# Patient Record
Sex: Female | Born: 1985 | Race: White | Hispanic: No | Marital: Single | Smoking: Never smoker
Health system: Southern US, Community
[De-identification: ages and names within clinical notes are randomized; demographics above are authoritative.]

## PROBLEM LIST (undated history)

## (undated) ENCOUNTER — Inpatient Hospital Stay: Payer: Self-pay

## (undated) ENCOUNTER — Inpatient Hospital Stay (HOSPITAL_COMMUNITY): Payer: Self-pay

## (undated) DIAGNOSIS — A6000 Herpesviral infection of urogenital system, unspecified: Secondary | ICD-10-CM

## (undated) DIAGNOSIS — T4145XA Adverse effect of unspecified anesthetic, initial encounter: Secondary | ICD-10-CM

## (undated) DIAGNOSIS — T8859XA Other complications of anesthesia, initial encounter: Secondary | ICD-10-CM

## (undated) HISTORY — PX: EYE SURGERY: SHX253

---
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
## 2006-09-02 ENCOUNTER — Emergency Department: Payer: Self-pay | Admitting: Emergency Medicine

## 2006-10-13 ENCOUNTER — Emergency Department: Payer: Self-pay | Admitting: Emergency Medicine

## 2006-11-22 ENCOUNTER — Emergency Department: Payer: Self-pay | Admitting: Emergency Medicine

## 2006-12-06 ENCOUNTER — Emergency Department: Payer: Self-pay | Admitting: Emergency Medicine

## 2007-06-13 ENCOUNTER — Emergency Department: Payer: Self-pay | Admitting: Emergency Medicine

## 2007-07-19 ENCOUNTER — Emergency Department: Payer: Self-pay

## 2007-07-30 ENCOUNTER — Ambulatory Visit: Payer: Self-pay | Admitting: Family Medicine

## 2007-10-12 ENCOUNTER — Ambulatory Visit: Payer: Self-pay | Admitting: Family Medicine

## 2007-11-03 ENCOUNTER — Observation Stay: Payer: Self-pay

## 2007-11-04 ENCOUNTER — Ambulatory Visit: Payer: Self-pay | Admitting: Family Medicine

## 2007-11-26 ENCOUNTER — Observation Stay: Payer: Self-pay | Admitting: Obstetrics and Gynecology

## 2007-11-30 ENCOUNTER — Observation Stay: Payer: Self-pay

## 2007-12-10 ENCOUNTER — Observation Stay: Payer: Self-pay | Admitting: Obstetrics and Gynecology

## 2007-12-24 ENCOUNTER — Observation Stay: Payer: Self-pay | Admitting: Obstetrics and Gynecology

## 2007-12-31 ENCOUNTER — Inpatient Hospital Stay: Payer: Self-pay

## 2008-04-06 ENCOUNTER — Emergency Department: Payer: Self-pay | Admitting: Emergency Medicine

## 2010-06-15 ENCOUNTER — Emergency Department: Payer: Self-pay | Admitting: Emergency Medicine

## 2010-12-02 ENCOUNTER — Emergency Department: Payer: Self-pay | Admitting: Emergency Medicine

## 2011-04-09 ENCOUNTER — Emergency Department: Payer: Self-pay | Admitting: *Deleted

## 2011-04-09 LAB — HCG, QUANTITATIVE, PREGNANCY: Beta Hcg, Quant.: 20249 m[IU]/mL — ABNORMAL HIGH

## 2011-04-09 LAB — URINALYSIS, COMPLETE
Bilirubin,UR: NEGATIVE
Blood: NEGATIVE
Glucose,UR: NEGATIVE mg/dL (ref 0–75)
Ketone: NEGATIVE
Nitrite: NEGATIVE
Ph: 6 (ref 4.5–8.0)
Protein: NEGATIVE
RBC,UR: 9 /HPF (ref 0–5)
Specific Gravity: 1.025 (ref 1.003–1.030)
Squamous Epithelial: 64
WBC UR: 7 /HPF (ref 0–5)

## 2011-04-09 LAB — WET PREP, GENITAL

## 2011-04-11 LAB — URINE CULTURE

## 2011-04-23 ENCOUNTER — Encounter (HOSPITAL_COMMUNITY): Payer: Self-pay | Admitting: *Deleted

## 2011-04-23 ENCOUNTER — Inpatient Hospital Stay (HOSPITAL_COMMUNITY)
Admission: AD | Admit: 2011-04-23 | Discharge: 2011-04-23 | Disposition: A | Discharge status: Home / Self Care | Payer: Medicaid Other | Source: Ambulatory Visit | Attending: Obstetrics and Gynecology | Admitting: Obstetrics and Gynecology

## 2011-04-23 DIAGNOSIS — O479 False labor, unspecified: Secondary | ICD-10-CM

## 2011-04-23 DIAGNOSIS — R109 Unspecified abdominal pain: Secondary | ICD-10-CM | POA: Insufficient documentation

## 2011-04-23 DIAGNOSIS — O99891 Other specified diseases and conditions complicating pregnancy: Secondary | ICD-10-CM | POA: Insufficient documentation

## 2011-04-23 HISTORY — DX: Herpesviral infection of urogenital system, unspecified: A60.00

## 2011-04-23 LAB — URINALYSIS, ROUTINE W REFLEX MICROSCOPIC
Bilirubin Urine: NEGATIVE
Glucose, UA: NEGATIVE mg/dL
Ketones, ur: NEGATIVE mg/dL
Leukocytes, UA: NEGATIVE
Nitrite: NEGATIVE
Protein, ur: NEGATIVE mg/dL
Specific Gravity, Urine: >1.03 — ABNORMAL HIGH (ref 1.005–1.030)
Urobilinogen, UA: 0.2 mg/dL (ref 0.0–1.0)
pH: 6 (ref 5.0–8.0)

## 2011-04-23 LAB — URINE MICROSCOPIC-ADD ON

## 2011-04-23 MED ORDER — IBUPROFEN 200 MG PO TABS: 600.0000 mg | ORAL_TABLET | Freq: Four times a day (QID) | ORAL | Status: DC | PRN

## 2011-04-23 NOTE — MAU Note (Signed)
PT SAYS  AT  545PM-   FELT BACK HURTING AND  COME AROUND TO FRONT-.  DID NOT TAKE ANY MED.   [facility]- WITH DR Phineas Real IN [place]-- DID NOT CALL OFFICE

## 2011-04-23 NOTE — Discharge Instructions (Signed)
Abdominal Pain During Pregnancy °Belly (abdominal) pain is common during pregnancy. Most of the time, it is not a serious problem. Other times, it can be a sign that something is wrong with the pregnancy. Always tell your doctor if you have belly pain. °HOME CARE °For mild pain: °· Do not have sex (intercourse) or put anything in your vagina until you feel better.  °· Rest until your pain stops. If your pain lasts longer than 1 hour, call your doctor.  °· Drink clear fluids if you feel sick to your stomach (nauseous).  °· Do not eat solid food until you feel better.  °· Only take medicine as told by your doctor.  °· Keep all doctor visits as told.  °GET HELP RIGHT AWAY IF:  °· You are bleeding, leaking fluid, or pieces of tissue come out of your vagina.  °· You have more pain or cramping.  °· You keep throwing up (vomiting).  °· You have pain when you pee (urinate) or have blood in your pee.  °· You have a fever.  °· You do not feel your baby moving as much.  °· You feel very weak or feel like passing out.  °· You have trouble breathing, with or without belly pain.  °· You have a very bad headache and belly pain.  °· You have fluid leaking from your vagina and belly pain.  °· You keep having watery poop (diarrhea).  °· Your belly pain does not go away after resting, or the pain gets worse.  °MAKE SURE YOU:  °· Understand these instructions.  °· Will watch your condition.  °· Will get help right away if you are not doing well or get worse.  °Document Released: 12/12/2008 Document Revised: 12/13/2010 Document Reviewed: 07/20/2010 °ExitCare® Patient Information ©2012 [employer]. °

## 2011-04-23 NOTE — MAU Note (Signed)
Pt in c/o contraction-like pain that starts in back and radiates to front, making stomach very tight.  States pains started around 6 pm.  Denies any bleeding or discharge.  Occasional fetal movements.

## 2011-04-23 NOTE — MAU Provider Note (Signed)
  History     CSN: [id number]  Arrival date and time: 04/23/11 2036   First Provider Initiated Contact with Patient 04/23/11 2156      No chief complaint on file.  HPI  OB History    Grav Para Term Preterm Abortions TAB SAB Ect Mult Living   3 2 2       2       Past Medical History  Diagnosis Date  . Herpes genitalia     Past Surgical History  Procedure Date  . Eye surgery     Family History  Problem Relation Age of Onset  . Anesthesia problems Neg Hx     History  Substance Use Topics  . Smoking status: Never Smoker   . Smokeless tobacco: Not on file  . Alcohol Use: No    Allergies: No Known Allergies  No prescriptions prior to admission    ROS Physical Exam   Blood pressure 116/61, pulse 97, temperature 97.4 F (36.3 C), temperature source Oral, resp. rate 18, height 5\' 5"  (1.651 m), weight 91.173 kg (201 lb).  Physical Exam  Constitutional: She is oriented to person, place, and time. She appears well-developed and well-nourished.  HENT:  Head: Normocephalic.  Neck: Normal range of motion.  Cardiovascular: Normal rate.   Respiratory: Effort normal.  GI:       Soft; gravid; approp for dates  Genitourinary:        cx C/L  Musculoskeletal: Normal range of motion.  Neurological: She is alert and oriented to person, place, and time.  Skin: Skin is warm and dry.  Psychiatric: She has a normal mood and affect. Her behavior is normal. Thought content normal.   Urinalysis    Component Value Date/Time   COLORURINE YELLOW 04/23/2011 2048   APPEARANCEUR CLEAR 04/23/2011 2048   LABSPEC >1.030* 04/23/2011 2048   PHURINE 6.0 04/23/2011 2048   GLUCOSEU NEGATIVE 04/23/2011 2048   HGBUR TRACE* 04/23/2011 2048   BILIRUBINUR NEGATIVE 04/23/2011 2048   KETONESUR NEGATIVE 04/23/2011 2048   PROTEINUR NEGATIVE 04/23/2011 2048   UROBILINOGEN 0.2 04/23/2011 2048   NITRITE NEGATIVE 04/23/2011 2048   LEUKOCYTESUR NEGATIVE 04/23/2011 2048      MAU Course   Procedures     Assessment and Plan  IUP at 17.5 wks Abd cramping  D/C home with comfort tips Increase fluid/water intake Rx Motrin 600mg  q 6 hrs x 72 hrs F/U at reg OB visit or sooner prn  Roselle Norton 04/23/2011, 10:04 PM

## 2011-04-24 NOTE — MAU Provider Note (Signed)
Agree with above note.  Krystal Harrison 04/24/2011 6:57 AM

## 2011-04-26 ENCOUNTER — Inpatient Hospital Stay (HOSPITAL_COMMUNITY): Payer: Medicaid Other

## 2011-04-26 ENCOUNTER — Encounter (HOSPITAL_COMMUNITY): Payer: Self-pay

## 2011-04-26 ENCOUNTER — Inpatient Hospital Stay (HOSPITAL_COMMUNITY)
Admission: AD | Admit: 2011-04-26 | Discharge: 2011-04-26 | Disposition: A | Discharge status: Home / Self Care | Payer: Medicaid Other | Source: Ambulatory Visit | Attending: Obstetrics & Gynecology | Admitting: Obstetrics & Gynecology

## 2011-04-26 DIAGNOSIS — O99891 Other specified diseases and conditions complicating pregnancy: Secondary | ICD-10-CM | POA: Insufficient documentation

## 2011-04-26 DIAGNOSIS — O26899 Other specified pregnancy related conditions, unspecified trimester: Secondary | ICD-10-CM

## 2011-04-26 DIAGNOSIS — S301XXA Contusion of abdominal wall, initial encounter: Secondary | ICD-10-CM

## 2011-04-26 DIAGNOSIS — R109 Unspecified abdominal pain: Secondary | ICD-10-CM

## 2011-04-26 DIAGNOSIS — W010XXA Fall on same level from slipping, tripping and stumbling without subsequent striking against object, initial encounter: Secondary | ICD-10-CM | POA: Insufficient documentation

## 2011-04-26 LAB — URINALYSIS, ROUTINE W REFLEX MICROSCOPIC
Bilirubin Urine: NEGATIVE
Glucose, UA: NEGATIVE mg/dL
Ketones, ur: NEGATIVE mg/dL
Nitrite: NEGATIVE
Protein, ur: NEGATIVE mg/dL
Specific Gravity, Urine: 1.025 (ref 1.005–1.030)
Urobilinogen, UA: 0.2 mg/dL (ref 0.0–1.0)
pH: 5.5 (ref 5.0–8.0)

## 2011-04-26 LAB — URINE MICROSCOPIC-ADD ON

## 2011-04-26 NOTE — MAU Provider Note (Signed)
History     CSN: [id number]  Arrival date & time 04/26/11  1550   None     Chief Complaint  Patient presents with  . Fall    HPI Krystal Harrison is a 26 y.o. female @ [redacted]w[redacted]d gestation who presents to MAU for abdominal pain after a fall on her abdomen last night. States she tripped over her child's chair and landed on her abdomen on a carpeted floor. She was evaluated by the OB in [facility] had pelvic exam and he ordered ultrasound at [facility]. Patient came to MAU instead. Patient c/o cramping in lower abdomen today. Denies vaginal bleeding or leaking of fluid. The history was provided by the patient.  Past Medical History  Diagnosis Date  . Herpes genitalia     last outbreak 1-2 yrs ago    Past Surgical History  Procedure Date  . Eye surgery     Family History  Problem Relation Age of Onset  . Anesthesia problems Neg Hx   . Diabetes Mother     History  Substance Use Topics  . Smoking status: Never Smoker   . Smokeless tobacco: Not on file  . Alcohol Use: No    OB History    Grav Para Term Preterm Abortions TAB SAB Ect Mult Living   3 2 2  0 0 0 0 0 0 2      Review of Systems  Constitutional: Negative for fever, chills, diaphoresis and fatigue.  HENT: Negative for ear pain, congestion, sore throat, facial swelling, neck pain, neck stiffness, dental problem and sinus pressure.   Eyes: Negative for photophobia, pain and discharge.  Respiratory: Negative for cough, chest tightness and wheezing.   Cardiovascular: Negative.   Gastrointestinal: Positive for abdominal pain. Negative for nausea, vomiting, diarrhea, constipation and abdominal distention.  Genitourinary: Negative for dysuria, frequency, flank pain, vaginal bleeding, vaginal discharge, difficulty urinating and vaginal pain.  Musculoskeletal: Positive for back pain. Negative for myalgias and gait problem.  Skin: Negative for color change and rash.  Neurological: Negative for dizziness, speech difficulty,  weakness, light-headedness, numbness and headaches.  Psychiatric/Behavioral: Negative for confusion and agitation. The patient is not nervous/anxious.     Allergies  Review of patient's allergies indicates no known allergies.  Home Medications  No current outpatient prescriptions on file.  BP 132/80  Pulse 100  Temp(Src) 97.9 F (36.6 C) (Oral)  Resp 16  Ht 5\' 5"  (1.651 m)  Wt 202 lb 3.2 oz (91.717 kg)  BMI 33.65 kg/m2  SpO2 100%  Physical Exam  Nursing note and vitals reviewed. Constitutional: She is oriented to person, place, and time. She appears well-developed and well-nourished. No distress.  HENT:  Head: Normocephalic.  Eyes: EOM are normal.  Neck: Neck supple.  Pulmonary/Chest: Effort normal.  Abdominal: Soft. There is tenderness.       Mildly tender right side of abdomen with palpation.  Genitourinary:       Not repeated since done by OB in [place] prior to patient coming to MAU.  Musculoskeletal: Normal range of motion.  Neurological: She is alert and oriented to person, place, and time. No cranial nerve deficit.  Skin: Skin is warm and dry.  Psychiatric: She has a normal mood and affect. Her behavior is normal. Judgment and thought content normal.   Ultrasound:  18.1 week IUP with cardiac activity 161. Normal fluid, no abruption  Assessment: Abdominal pain s/p fall @ 18.[redacted] weeks gestation  Plan:  Follow up with OB in [place]   Return  here as needed. ED Course  Procedures   MDM

## 2011-04-26 NOTE — Discharge Instructions (Signed)
YOUR ULTRASOUND TODAY SHOWS THAT THERE IS NO BLEEDING AROUND THE PLACENTA AND THE FLUID AROUND THE BABY IS NORMAL. TAKE TYLENOL FOR YOUR DISCOMFORT. FOLLOW UP WITH YOUR DOCTOR IN [place]. RETURN HERE AS NEEDED.

## 2011-04-26 NOTE — MAU Note (Signed)
Patient states she gets her prenatal care in [place], plans to move to [place] but not sure of the practice. Last night at 2200 she tripped over a chair and fell onto her abdomen. Hit her abdomen but broke her fall direct fall with her hands. Went to her MD in [place] this am and she was told to go to [facility] for an ultrasound but wanted to come to MAU. Denies any bleeding or leaking fluid but has been having cramping.

## 2011-06-05 ENCOUNTER — Observation Stay: Payer: Self-pay

## 2011-07-05 ENCOUNTER — Observation Stay: Payer: Self-pay | Admitting: Obstetrics and Gynecology

## 2011-07-05 LAB — URINALYSIS, COMPLETE
Bilirubin,UR: NEGATIVE
Blood: NEGATIVE
Glucose,UR: NEGATIVE mg/dL (ref 0–75)
Ketone: NEGATIVE
Nitrite: NEGATIVE
Ph: 6 (ref 4.5–8.0)
Protein: NEGATIVE
RBC,UR: 4 /HPF (ref 0–5)
Specific Gravity: 1.017 (ref 1.003–1.030)
Squamous Epithelial: 41
Transitional Epi: <1
WBC UR: 12 /HPF (ref 0–5)

## 2011-07-31 ENCOUNTER — Inpatient Hospital Stay: Payer: Self-pay | Admitting: Obstetrics and Gynecology

## 2011-07-31 LAB — CBC WITH DIFFERENTIAL/PLATELET
Basophil #: 0 10*3/uL (ref 0.0–0.1)
Basophil %: 0.2 %
Eosinophil #: 0.1 10*3/uL (ref 0.0–0.7)
Eosinophil %: 0.8 %
HCT: 32.5 % — ABNORMAL LOW (ref 35.0–47.0)
HGB: 10.6 g/dL — ABNORMAL LOW (ref 12.0–16.0)
Lymphocyte #: 1.9 10*3/uL (ref 1.0–3.6)
Lymphocyte %: 18.3 %
MCH: 27.6 pg (ref 26.0–34.0)
MCHC: 32.5 g/dL (ref 32.0–36.0)
MCV: 85 fL (ref 80–100)
Monocyte #: 0.9 x10 3/mm (ref 0.2–0.9)
Monocyte %: 8.4 %
Neutrophil #: 7.3 10*3/uL — ABNORMAL HIGH (ref 1.4–6.5)
Neutrophil %: 72.3 %
Platelet: 197 10*3/uL (ref 150–440)
RBC: 3.82 10*6/uL (ref 3.80–5.20)
RDW: 14.9 % — ABNORMAL HIGH (ref 11.5–14.5)
WBC: 10.2 10*3/uL (ref 3.6–11.0)

## 2011-08-01 LAB — CBC WITH DIFFERENTIAL/PLATELET
Basophil #: 0 10*3/uL (ref 0.0–0.1)
Basophil %: 0.1 %
Eosinophil #: 0 10*3/uL (ref 0.0–0.7)
Eosinophil %: 0.1 %
HCT: 30.4 % — ABNORMAL LOW (ref 35.0–47.0)
HGB: 10 g/dL — ABNORMAL LOW (ref 12.0–16.0)
Lymphocyte #: 1.4 10*3/uL (ref 1.0–3.6)
Lymphocyte %: 9.8 %
MCH: 28 pg (ref 26.0–34.0)
MCHC: 32.8 g/dL (ref 32.0–36.0)
MCV: 86 fL (ref 80–100)
Monocyte #: 0.8 x10 3/mm (ref 0.2–0.9)
Monocyte %: 5.8 %
Neutrophil #: 12 10*3/uL — ABNORMAL HIGH (ref 1.4–6.5)
Neutrophil %: 84.2 %
Platelet: 213 10*3/uL (ref 150–440)
RBC: 3.56 10*6/uL — ABNORMAL LOW (ref 3.80–5.20)
RDW: 15 % — ABNORMAL HIGH (ref 11.5–14.5)
WBC: 14.2 10*3/uL — ABNORMAL HIGH (ref 3.6–11.0)

## 2011-08-02 LAB — CBC WITH DIFFERENTIAL/PLATELET
Basophil #: 0 10*3/uL (ref 0.0–0.1)
Basophil %: 0.1 %
Eosinophil #: 0 10*3/uL (ref 0.0–0.7)
Eosinophil %: 0 %
HCT: 29.8 % — ABNORMAL LOW (ref 35.0–47.0)
HGB: 10.1 g/dL — ABNORMAL LOW (ref 12.0–16.0)
Lymphocyte #: 1.5 10*3/uL (ref 1.0–3.6)
Lymphocyte %: 9.7 %
MCH: 28.4 pg (ref 26.0–34.0)
MCHC: 33.9 g/dL (ref 32.0–36.0)
MCV: 84 fL (ref 80–100)
Monocyte #: 0.9 x10 3/mm (ref 0.2–0.9)
Monocyte %: 5.8 %
Neutrophil #: 13.1 10*3/uL — ABNORMAL HIGH (ref 1.4–6.5)
Neutrophil %: 84.4 %
Platelet: 206 10*3/uL (ref 150–440)
RBC: 3.56 10*6/uL — ABNORMAL LOW (ref 3.80–5.20)
RDW: 15.1 % — ABNORMAL HIGH (ref 11.5–14.5)
WBC: 15.6 10*3/uL — ABNORMAL HIGH (ref 3.6–11.0)

## 2011-08-03 LAB — CBC WITH DIFFERENTIAL/PLATELET
Basophil #: 0 10*3/uL (ref 0.0–0.1)
Basophil %: 0.1 %
Eosinophil #: 0.1 10*3/uL (ref 0.0–0.7)
Eosinophil %: 0.8 %
HCT: 26 % — ABNORMAL LOW (ref 35.0–47.0)
HGB: 8.8 g/dL — ABNORMAL LOW (ref 12.0–16.0)
Lymphocyte #: 2.2 10*3/uL (ref 1.0–3.6)
Lymphocyte %: 15.2 %
MCH: 28.5 pg (ref 26.0–34.0)
MCHC: 33.9 g/dL (ref 32.0–36.0)
MCV: 84 fL (ref 80–100)
Monocyte #: 1.5 x10 3/mm — ABNORMAL HIGH (ref 0.2–0.9)
Monocyte %: 10.5 %
Neutrophil #: 10.6 10*3/uL — ABNORMAL HIGH (ref 1.4–6.5)
Neutrophil %: 73.4 %
Platelet: 187 10*3/uL (ref 150–440)
RBC: 3.09 10*6/uL — ABNORMAL LOW (ref 3.80–5.20)
RDW: 15.4 % — ABNORMAL HIGH (ref 11.5–14.5)
WBC: 14.4 10*3/uL — ABNORMAL HIGH (ref 3.6–11.0)

## 2011-08-05 LAB — CBC WITH DIFFERENTIAL/PLATELET
Basophil #: 0 10*3/uL (ref 0.0–0.1)
Basophil %: 0.1 %
Eosinophil #: 0.1 10*3/uL (ref 0.0–0.7)
Eosinophil %: 1.1 %
HCT: 29.9 % — ABNORMAL LOW (ref 35.0–47.0)
HGB: 10 g/dL — ABNORMAL LOW (ref 12.0–16.0)
Lymphocyte #: 2.2 10*3/uL (ref 1.0–3.6)
Lymphocyte %: 16.8 %
MCH: 27.8 pg (ref 26.0–34.0)
MCHC: 33.4 g/dL (ref 32.0–36.0)
MCV: 83 fL (ref 80–100)
Monocyte #: 1 x10 3/mm — ABNORMAL HIGH (ref 0.2–0.9)
Monocyte %: 8.1 %
Neutrophil #: 9.6 10*3/uL — ABNORMAL HIGH (ref 1.4–6.5)
Neutrophil %: 73.9 %
Platelet: 225 10*3/uL (ref 150–440)
RBC: 3.6 10*6/uL — ABNORMAL LOW (ref 3.80–5.20)
RDW: 15.3 % — ABNORMAL HIGH (ref 11.5–14.5)
WBC: 13 10*3/uL — ABNORMAL HIGH (ref 3.6–11.0)

## 2011-08-06 LAB — CBC WITH DIFFERENTIAL/PLATELET
Basophil #: 0 10*3/uL (ref 0.0–0.1)
Basophil %: 0.2 %
Eosinophil #: 0.2 10*3/uL (ref 0.0–0.7)
Eosinophil %: 1.1 %
HCT: 30.7 % — ABNORMAL LOW (ref 35.0–47.0)
HGB: 10.5 g/dL — ABNORMAL LOW (ref 12.0–16.0)
Lymphocyte #: 2.4 10*3/uL (ref 1.0–3.6)
Lymphocyte %: 15.7 %
MCH: 28.4 pg (ref 26.0–34.0)
MCHC: 34.1 g/dL (ref 32.0–36.0)
MCV: 83 fL (ref 80–100)
Monocyte #: 1.2 x10 3/mm — ABNORMAL HIGH (ref 0.2–0.9)
Monocyte %: 8 %
Neutrophil #: 11.3 10*3/uL — ABNORMAL HIGH (ref 1.4–6.5)
Neutrophil %: 75 %
Platelet: 240 10*3/uL (ref 150–440)
RBC: 3.68 10*6/uL — ABNORMAL LOW (ref 3.80–5.20)
RDW: 15.4 % — ABNORMAL HIGH (ref 11.5–14.5)
WBC: 15.1 10*3/uL — ABNORMAL HIGH (ref 3.6–11.0)

## 2011-08-07 LAB — CBC WITH DIFFERENTIAL/PLATELET
Basophil #: 0 10*3/uL (ref 0.0–0.1)
Basophil %: 0.2 %
Eosinophil #: 0.2 10*3/uL (ref 0.0–0.7)
Eosinophil %: 1.2 %
HCT: 32.4 % — ABNORMAL LOW (ref 35.0–47.0)
HGB: 11 g/dL — ABNORMAL LOW (ref 12.0–16.0)
Lymphocyte #: 2.6 10*3/uL (ref 1.0–3.6)
Lymphocyte %: 15.3 %
MCH: 28.5 pg (ref 26.0–34.0)
MCHC: 33.9 g/dL (ref 32.0–36.0)
MCV: 84 fL (ref 80–100)
Monocyte #: 1.5 x10 3/mm — ABNORMAL HIGH (ref 0.2–0.9)
Monocyte %: 8.6 %
Neutrophil #: 12.9 10*3/uL — ABNORMAL HIGH (ref 1.4–6.5)
Neutrophil %: 74.7 %
Platelet: 245 10*3/uL (ref 150–440)
RBC: 3.86 10*6/uL (ref 3.80–5.20)
RDW: 15.6 % — ABNORMAL HIGH (ref 11.5–14.5)
WBC: 17.2 10*3/uL — ABNORMAL HIGH (ref 3.6–11.0)

## 2011-08-08 LAB — CBC WITH DIFFERENTIAL/PLATELET
Basophil #: 0 10*3/uL (ref 0.0–0.1)
Basophil %: 0.2 %
Eosinophil #: 0.2 10*3/uL (ref 0.0–0.7)
Eosinophil %: 1.2 %
HCT: 32 % — ABNORMAL LOW (ref 35.0–47.0)
HGB: 10.9 g/dL — ABNORMAL LOW (ref 12.0–16.0)
Lymphocyte #: 2.6 10*3/uL (ref 1.0–3.6)
Lymphocyte %: 14.1 %
MCH: 28.3 pg (ref 26.0–34.0)
MCHC: 33.9 g/dL (ref 32.0–36.0)
MCV: 83 fL (ref 80–100)
Monocyte #: 1.4 x10 3/mm — ABNORMAL HIGH (ref 0.2–0.9)
Monocyte %: 7.5 %
Neutrophil #: 14.4 10*3/uL — ABNORMAL HIGH (ref 1.4–6.5)
Neutrophil %: 77 %
Platelet: 250 10*3/uL (ref 150–440)
RBC: 3.84 10*6/uL (ref 3.80–5.20)
RDW: 15.5 % — ABNORMAL HIGH (ref 11.5–14.5)
WBC: 18.8 10*3/uL — ABNORMAL HIGH (ref 3.6–11.0)

## 2011-08-09 LAB — CBC WITH DIFFERENTIAL/PLATELET
Basophil #: 0 10*3/uL (ref 0.0–0.1)
Basophil %: 0.2 %
Eosinophil #: 0.2 10*3/uL (ref 0.0–0.7)
Eosinophil %: 1 %
HCT: 33.1 % — ABNORMAL LOW (ref 35.0–47.0)
HGB: 11.1 g/dL — ABNORMAL LOW (ref 12.0–16.0)
Lymphocyte #: 2.8 10*3/uL (ref 1.0–3.6)
Lymphocyte %: 17 %
MCH: 27.9 pg (ref 26.0–34.0)
MCHC: 33.5 g/dL (ref 32.0–36.0)
MCV: 83 fL (ref 80–100)
Monocyte #: 1.4 x10 3/mm — ABNORMAL HIGH (ref 0.2–0.9)
Monocyte %: 8.2 %
Neutrophil #: 12.3 10*3/uL — ABNORMAL HIGH (ref 1.4–6.5)
Neutrophil %: 73.6 %
Platelet: 251 10*3/uL (ref 150–440)
RBC: 3.97 10*6/uL (ref 3.80–5.20)
RDW: 15.2 % — ABNORMAL HIGH (ref 11.5–14.5)
WBC: 16.7 10*3/uL — ABNORMAL HIGH (ref 3.6–11.0)

## 2011-08-10 LAB — CBC WITH DIFFERENTIAL/PLATELET
Basophil #: 0 10*3/uL (ref 0.0–0.1)
Basophil %: 0.3 %
Eosinophil #: 0.1 10*3/uL (ref 0.0–0.7)
Eosinophil %: 0.9 %
HCT: 31.4 % — ABNORMAL LOW (ref 35.0–47.0)
HGB: 10.8 g/dL — ABNORMAL LOW (ref 12.0–16.0)
Lymphocyte #: 2.6 10*3/uL (ref 1.0–3.6)
Lymphocyte %: 15.6 %
MCH: 28.5 pg (ref 26.0–34.0)
MCHC: 34.3 g/dL (ref 32.0–36.0)
MCV: 83 fL (ref 80–100)
Monocyte #: 1.3 x10 3/mm — ABNORMAL HIGH (ref 0.2–0.9)
Monocyte %: 7.8 %
Neutrophil #: 12.4 10*3/uL — ABNORMAL HIGH (ref 1.4–6.5)
Neutrophil %: 75.4 %
Platelet: 246 10*3/uL (ref 150–440)
RBC: 3.78 10*6/uL — ABNORMAL LOW (ref 3.80–5.20)
RDW: 15.6 % — ABNORMAL HIGH (ref 11.5–14.5)
WBC: 16.4 10*3/uL — ABNORMAL HIGH (ref 3.6–11.0)

## 2011-08-12 LAB — CBC WITH DIFFERENTIAL/PLATELET
Basophil #: 0 10*3/uL (ref 0.0–0.1)
Basophil %: 0.1 %
Eosinophil #: 0.1 10*3/uL (ref 0.0–0.7)
Eosinophil %: 0.9 %
HCT: 31.7 % — ABNORMAL LOW (ref 35.0–47.0)
HGB: 10.5 g/dL — ABNORMAL LOW (ref 12.0–16.0)
Lymphocyte #: 2.7 10*3/uL (ref 1.0–3.6)
Lymphocyte %: 15.7 %
MCH: 27.9 pg (ref 26.0–34.0)
MCHC: 33.1 g/dL (ref 32.0–36.0)
MCV: 84 fL (ref 80–100)
Monocyte #: 1 x10 3/mm — ABNORMAL HIGH (ref 0.2–0.9)
Monocyte %: 5.9 %
Neutrophil #: 13.1 10*3/uL — ABNORMAL HIGH (ref 1.4–6.5)
Neutrophil %: 77.4 %
Platelet: 239 10*3/uL (ref 150–440)
RBC: 3.76 10*6/uL — ABNORMAL LOW (ref 3.80–5.20)
RDW: 16.1 % — ABNORMAL HIGH (ref 11.5–14.5)
WBC: 17 10*3/uL — ABNORMAL HIGH (ref 3.6–11.0)

## 2011-08-30 ENCOUNTER — Observation Stay: Payer: Self-pay | Admitting: Obstetrics and Gynecology

## 2011-08-31 ENCOUNTER — Observation Stay: Payer: Self-pay | Admitting: Obstetrics and Gynecology

## 2011-09-11 ENCOUNTER — Inpatient Hospital Stay: Payer: Self-pay

## 2011-09-11 LAB — CBC WITH DIFFERENTIAL/PLATELET
Basophil #: 0 10*3/uL (ref 0.0–0.1)
Basophil %: 0.3 %
Eosinophil #: 0.1 10*3/uL (ref 0.0–0.7)
Eosinophil %: 0.6 %
HCT: 34.7 % — ABNORMAL LOW (ref 35.0–47.0)
HGB: 11.5 g/dL — ABNORMAL LOW (ref 12.0–16.0)
Lymphocyte #: 2.1 10*3/uL (ref 1.0–3.6)
Lymphocyte %: 14.5 %
MCH: 27.3 pg (ref 26.0–34.0)
MCHC: 33 g/dL (ref 32.0–36.0)
MCV: 83 fL (ref 80–100)
Monocyte #: 1.1 x10 3/mm — ABNORMAL HIGH (ref 0.2–0.9)
Monocyte %: 7.5 %
Neutrophil #: 11.3 10*3/uL — ABNORMAL HIGH (ref 1.4–6.5)
Neutrophil %: 77.1 %
Platelet: 251 10*3/uL (ref 150–440)
RBC: 4.19 10*6/uL (ref 3.80–5.20)
RDW: 17.3 % — ABNORMAL HIGH (ref 11.5–14.5)
WBC: 14.6 10*3/uL — ABNORMAL HIGH (ref 3.6–11.0)

## 2011-09-13 LAB — HEMATOCRIT: HCT: 32.6 % — ABNORMAL LOW (ref 35.0–47.0)

## 2011-09-25 ENCOUNTER — Telehealth (HOSPITAL_COMMUNITY): Payer: Self-pay | Admitting: *Deleted

## 2011-09-25 NOTE — Telephone Encounter (Signed)
Preadmission screen  

## 2011-10-01 ENCOUNTER — Telehealth (HOSPITAL_COMMUNITY): Payer: Self-pay | Admitting: *Deleted

## 2011-10-01 NOTE — Telephone Encounter (Signed)
Preadmission screen  

## 2011-11-03 ENCOUNTER — Emergency Department: Payer: Self-pay | Admitting: Emergency Medicine

## 2013-05-26 ENCOUNTER — Emergency Department: Payer: Self-pay | Admitting: Internal Medicine

## 2013-06-03 DIAGNOSIS — S83419A Sprain of medial collateral ligament of unspecified knee, initial encounter: Secondary | ICD-10-CM | POA: Insufficient documentation

## 2013-10-21 ENCOUNTER — Emergency Department: Payer: Self-pay | Admitting: Emergency Medicine

## 2013-11-08 ENCOUNTER — Encounter (HOSPITAL_COMMUNITY): Payer: Self-pay

## 2014-01-17 ENCOUNTER — Emergency Department: Payer: Self-pay | Admitting: Emergency Medicine

## 2014-04-26 NOTE — Discharge Summary (Signed)
PATIENT NAME:  Krystal Harrison, Krystal Harrison MR#:  [id number] DATE OF BIRTH:  [birth year]  DATE OF ADMISSION:  07/31/2011 DATE OF DISCHARGE:  08/12/2011  ADMISSION DIAGNOSIS: Preterm premature rupture of membranes.   DISCHARGE DIAGNOSIS: Preterm premature rupture of membranes.   CONSULTATIONS:  Neonatology to review with the patient.   HOSPITAL COURSE: The patient was a 29 year old female at 12 weeks with complaint of leaking fluids, was found to have preterm premature rupture of membranes. She received betamethasone antibiotics and 24 hours of magnesium for neural protection.    She was kept in house from the 24th of July to the 5th of August and was discharged home with no continued leakage, reassuring fetal monitoring with close follow-up.   PLAN:  1. Pelvic rest.  2. Continue p.o. antibiotics.  ____________________________ Rockey Situ. Amalia Hailey, MD rle:cbb D: 08/20/2011 12:38:02 ET T: 08/21/2011 09:42:02 ET JOB#: [id number]  cc: Ricky L. Amalia Hailey, MD, <Dictator> Selmer Dominion MD ELECTRONICALLY SIGNED 08/21/2011 13:11
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
## 2014-05-17 NOTE — H&P (Signed)
L&D Evaluation:  History:   HPI 36+ weeks para2 Admitted for PPROM at 32 weeks, d/c'd when stable    Presents with contractions, leaking fluid    Patient's Medical History No Chronic Illness    Patient's Surgical History none    Medications Pre Natal Vitamins    Allergies NKDA    Social History none    Family History Non-Contributory   ROS:   ROS All systems were reviewed.  HEENT, CNS, GI, GU, Respiratory, CV, Renal and Musculoskeletal systems were found to be normal.   Exam:   Vital Signs stable    Mental Status clear    Abdomen gravid, tender with contractions    Estimated Fetal Weight Average for gestational age    Back no CVAT    Mebranes Ntz -    FHT normal rate with no decels    Fetal Heart Rate 135    Ucx regular    Ucx Frequency 5 min    Skin dry   Impression:   Impression Possibly early labor   Plan:   Plan monitor contractions and for cervical change    Comments home if no significant change   Electronic Signatures: Edison Nasuti (MD)  (Signed 24-Aug-13 22:13)  Authored: L&D Evaluation   Last Updated: 24-Aug-13 22:13 by Edison Nasuti (MD)
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
## 2014-05-17 NOTE — H&P (Signed)
L&D Evaluation:  History:   HPI 29 y/o P2 WF EDC 09/26/2011 32 weeks    Presents with abdominal pain, leaking fluid    Patient's Medical History Asthma  GERD    Patient's Surgical History none    Medications Pre Natal Vitamins    Allergies flagyl    Family History Non-Contributory   ROS:   ROS All systems were reviewed.  HEENT, CNS, GI, GU, Respiratory, CV, Renal and Musculoskeletal systems were found to be normal.   Exam:   Vital Signs stable    Abdomen gravid, non-tender    Estimated Fetal Weight Average for gestational age    Back no CVAT    Pelvic cervix closed and thick, parous, OOP    Mebranes Ruptured, -NTZ; + Fern    FHT normal rate with no decels    Ucx rare    Skin dry   Impression:   Impression PPROM   Plan:   Comments Admission IV unasyn Obs on L&D for tonight, with transfer to floor in am if stable CBC today and QD AFI; EFW Steroids Deliver for s/sx chorio   Electronic Signatures: Edison Nasuti (MD)  (Signed 24-Jul-13 13:05)  Authored: L&D Evaluation   Last Updated: 24-Jul-13 13:05 by Edison Nasuti (MD)

## 2014-08-26 ENCOUNTER — Other Ambulatory Visit: Payer: Self-pay | Admitting: Family Medicine

## 2014-08-26 DIAGNOSIS — IMO0002 Reserved for concepts with insufficient information to code with codable children: Secondary | ICD-10-CM

## 2014-08-31 ENCOUNTER — Ambulatory Visit: Payer: Medicaid Other

## 2014-09-01 ENCOUNTER — Ambulatory Visit: Payer: Medicaid Other

## 2014-09-02 ENCOUNTER — Ambulatory Visit
Admission: RE | Admit: 2014-09-02 | Discharge: 2014-09-02 | Disposition: A | Discharge status: Home / Self Care | Payer: Medicaid Other | Source: Ambulatory Visit | Attending: Family Medicine | Admitting: Family Medicine

## 2014-09-02 DIAGNOSIS — N941 Dyspareunia: Secondary | ICD-10-CM | POA: Insufficient documentation

## 2014-09-02 DIAGNOSIS — N930 Postcoital and contact bleeding: Secondary | ICD-10-CM | POA: Diagnosis not present

## 2014-09-02 DIAGNOSIS — IMO0002 Reserved for concepts with insufficient information to code with codable children: Secondary | ICD-10-CM

## 2014-10-17 ENCOUNTER — Encounter: Payer: Self-pay | Admitting: Emergency Medicine

## 2014-10-17 ENCOUNTER — Emergency Department: Payer: Medicaid Other

## 2014-10-17 ENCOUNTER — Emergency Department
Admission: EM | Admit: 2014-10-17 | Discharge: 2014-10-18 | Disposition: A | Discharge status: Home / Self Care | Payer: Medicaid Other | Attending: Emergency Medicine | Admitting: Emergency Medicine

## 2014-10-17 DIAGNOSIS — Z79899 Other long term (current) drug therapy: Secondary | ICD-10-CM | POA: Diagnosis not present

## 2014-10-17 DIAGNOSIS — Z3A01 Less than 8 weeks gestation of pregnancy: Secondary | ICD-10-CM | POA: Insufficient documentation

## 2014-10-17 DIAGNOSIS — O009 Unspecified ectopic pregnancy without intrauterine pregnancy: Secondary | ICD-10-CM | POA: Diagnosis not present

## 2014-10-17 DIAGNOSIS — O209 Hemorrhage in early pregnancy, unspecified: Secondary | ICD-10-CM | POA: Diagnosis present

## 2014-10-17 DIAGNOSIS — O3680X0 Pregnancy with inconclusive fetal viability, not applicable or unspecified: Secondary | ICD-10-CM

## 2014-10-17 LAB — CBC WITH DIFFERENTIAL/PLATELET
Basophils Absolute: 0 10*3/uL (ref 0–0.1)
Basophils Relative: 1 %
Eosinophils Absolute: 0.2 10*3/uL (ref 0–0.7)
Eosinophils Relative: 2 %
HCT: 38.2 % (ref 35.0–47.0)
Hemoglobin: 12.5 g/dL (ref 12.0–16.0)
Lymphocytes Relative: 26 %
Lymphs Abs: 2.5 10*3/uL (ref 1.0–3.6)
MCH: 27.4 pg (ref 26.0–34.0)
MCHC: 32.8 g/dL (ref 32.0–36.0)
MCV: 83.8 fL (ref 80.0–100.0)
Monocytes Absolute: 0.7 10*3/uL (ref 0.2–0.9)
Monocytes Relative: 7 %
Neutro Abs: 6.2 10*3/uL (ref 1.4–6.5)
Neutrophils Relative %: 64 %
Platelets: 229 10*3/uL (ref 150–440)
RBC: 4.57 MIL/uL (ref 3.80–5.20)
RDW: 15.1 % — ABNORMAL HIGH (ref 11.5–14.5)
WBC: 9.6 10*3/uL (ref 3.6–11.0)

## 2014-10-17 LAB — HCG, QUANTITATIVE, PREGNANCY: hCG, Beta Chain, Quant, S: 16 m[IU]/mL — ABNORMAL HIGH (ref <5)

## 2014-10-17 NOTE — ED Provider Notes (Signed)
Time Seen: Approximately ----------------------------------------- 10:37 PM on 10/17/2014 -----------------------------------------  I have reviewed the triage notes  Chief Complaint: Vaginal Bleeding   History of Present Illness: Krystal Harrison is a 29 y.o. female who is stated to be currently gravida 4 para 3 approximately [redacted] weeks pregnant by her last menstrual period. Had home pregnancy tests and is currently followed by the [facility] group clinic. She states she was having intercourse last night and shortly thereafter she started developing some spotty vaginal bleeding. She states she passed one clot and otherwise is had some dark to bright red bleeding which is seemingly slowed throughout the day. She denies any heavy vaginal bleeding, feelings of lightheadedness, or abdominal pain. She denies any complications with her previous pregnancy except an early disruption of her fluid and had to have strict bed rest and total time was ready for the delivery. She denies any hypertension or diabetes. She has not noticed any difficulty with urination or vaginal discharge.   Past Medical History  Diagnosis Date  . Herpes genitalia     last outbreak 1-2 yrs ago    There are no active problems to display for this patient.   Past Surgical History  Procedure Laterality Date  . Eye surgery      Past Surgical History  Procedure Laterality Date  . Eye surgery      Current Outpatient Rx  Name  Route  Sig  Dispense  Refill  . Prenatal Vit-Fe Fumarate-FA (PRENATAL MULTIVITAMIN) TABS   Oral   Take 1 tablet by mouth daily.           Allergies:  Review of patient's allergies indicates no known allergies.  Family History: Family History  Problem Relation Age of Onset  . Anesthesia problems Neg Hx   . Diabetes Mother     Social History: Social History  Substance Use Topics  . Smoking status: Never Smoker   . Smokeless tobacco: None  . Alcohol Use: No     Review of Systems:    10 point review of systems was performed and was otherwise negative:  Constitutional: No fever Eyes: No visual disturbances ENT: No sore throat, ear pain Cardiac: No chest pain Respiratory: No shortness of breath, wheezing, or stridor Abdomen: No abdominal pain, no vomiting, No diarrhea Endocrine: No weight loss, No night sweats Extremities: No peripheral edema, cyanosis Skin: No rashes, easy bruising Neurologic: No focal weakness, trouble with speech or swollowing Urologic: No dysuria, Hematuria, or urinary frequency   Physical Exam:  ED Triage Vitals  Enc Vitals Group     BP 10/17/14 2215 127/74 mmHg     Pulse Rate 10/17/14 2215 76     Resp 10/17/14 2215 18     Temp 10/17/14 2215 97.7 F (36.5 C)     Temp Source 10/17/14 2215 Oral     SpO2 10/17/14 2215 100 %     Weight 10/17/14 2215 190 lb (86.183 kg)     Height 10/17/14 2215 5\' 5"  (1.651 m)     Head Cir --      Peak Flow --      Pain Score --      Pain Loc --      Pain Edu? --      Excl. in [place]? --     General: Awake , Alert , and Oriented times 3; GCS 15 Head: Normal cephalic , atraumatic Eyes: Pupils equal , round, reactive to light Nose/Throat: No nasal drainage, patent upper airway without erythema  or exudate.  Neck: Supple, Full range of motion, No anterior adenopathy or palpable thyroid masses Lungs: Clear to ascultation without wheezes , rhonchi, or rales Heart: Regular rate, regular rhythm without murmurs , gallops , or rubs Abdomen: Soft, non tender without rebound, guarding , or rigidity; bowel sounds positive and symmetric in all 4 quadrants. No organomegaly .        Extremities: 2 plus symmetric pulses. No edema, clubbing or cyanosis Neurologic: normal ambulation, Motor symmetric without deficits, sensory intact Skin: warm, dry, no rashes   Labs:   All laboratory work was reviewed including any pertinent negatives or positives listed below:  Labs Reviewed  HCG, QUANTITATIVE, PREGNANCY  CBC WITH  DIFFERENTIAL/PLATELET  ABO/RH     Radiology:  Pending I personally reviewed the radiologic studies     ED Course:  Patient's differential includes ectopic pregnancy, normal bleeding from her first trimester pregnancy threatened or spontaneous miscarriage, etc. The patient's results of her study are pending at this time be followed by Dr. Edd Fabian  Assessment: First trimester vaginal bleeding  Final Clinical Impression:  Final diagnoses:  None     Plan: Pending studies            Daymon Larsen, MD 10/17/14 2325

## 2014-10-17 NOTE — ED Notes (Signed)
Patient ambulatory to triage with steady gait, without difficulty or distress noted; pt reports approx [redacted]wks pregnant with vag bleeding; denies pain

## 2014-10-18 LAB — ABO/RH: ABO/RH(D): A POS

## 2014-10-18 NOTE — Discharge Instructions (Signed)
Follow[facility]up with [facility] in 2 days so that your pregnancy hormone level can be rechecked. Return immediately for severe abdominal pain, recurrent vomiting, heavy vaginal bleeding such that you are filling at least 1 large pad  per hour for at least 2 hours, chest pain, difficulty breathing, lightheadedness, or for any other concerns.

## 2014-10-18 NOTE — ED Provider Notes (Signed)
-----------------------------------------   12:44 AM on 10/18/2014 -----------------------------------------  Caries in from Dr. Marcelene Butte at 11 PM pending results of ultrasound and labs. CBC reviewed. Hemoglobin is stable at 12.5. Blood type A positive, no rhogam. HCG was only 16. Ultrasound shows no visualized IUP or suspicious adnexal finding. At this point, given low beta hCG, she has pregnancy of undetermined location. I discussed this with her. We also discussed need for follow-up with [facility] in 48 hours for recheck beta hCG. We discussed return precautions and she is, comfortable with discharge. DC home.  Joanne Gavel, MD 10/18/14 [phone]

## 2014-10-20 ENCOUNTER — Encounter: Payer: Self-pay | Admitting: Obstetrics and Gynecology

## 2014-10-20 ENCOUNTER — Ambulatory Visit (INDEPENDENT_AMBULATORY_CARE_PROVIDER_SITE_OTHER): Payer: Medicaid Other | Admitting: Obstetrics and Gynecology

## 2014-10-20 VITALS — BP 116/63 | HR 78 | Ht 65.0 in | Wt 187.1 lb

## 2014-10-20 DIAGNOSIS — N926 Irregular menstruation, unspecified: Secondary | ICD-10-CM

## 2014-10-20 NOTE — Progress Notes (Signed)
Subjective:     Patient ID: Krystal Harrison, female   DOB: 1985/06/29, 29 y.o.   MRN: [id number]  HPI Reports + UPT on 10/6 with spotting postcoital and clotting on 10/10 (none x 24 hours) and seen in ED with labs obtained. Here for follow up.[id number]  Review of Systems See above    Objective:   Physical Exam A&O x4 Well groomed female in no distress Pelvic exam deferred    Assessment:     First trimester bleeding- postcoital      Plan:     Repeat Quant Hcg today and will follow up with u/s in 10 days for viability Reviewed s/s miscarriage. To stay on pelvic rest x 1 week.  Eileen Kangas Trudee Kuster, CNM

## 2014-10-20 NOTE — Patient Instructions (Signed)
Thank you for enrolling in [facility]. Please follow the instructions below to securely access your online medical record. MyChart allows you to send messages to your doctor, view your test results, renew your prescriptions, schedule appointments, and more.  How Do I Sign Up? 1. In your Internet browser, go to [email or web address]. 2. Click on the New  User? link in the Sign In box.  3. Enter your MyChart Access Code exactly as it appears below. You will not need to use this code after you have completed the sign-up process. If you do not sign up before the expiration date, you must request a new code. MyChart Access Code: [email or web address] Expires: 10/25/2014 12:18 PM  4. Enter the last four digits of your Social Security Number (xxxx) and Date of Birth (mm/dd/yyyy) as indicated and click Next. You will be taken to the next sign-up page. 5. Create a MyChart ID. This will be your MyChart login ID and cannot be changed, so think of one that is secure and easy to remember. 6. Create a MyChart password. You can change your password at any time. 7. Enter your Password Reset Question and Answer and click Next. This can be used at a later time if you forget your password.  8. Select your communication preference, and if applicable enter your e-mail address. You will receive e-mail notification when new information is available in MyChart by choosing to receive e-mail notifications and filling in your e-mail. 9. Click Sign In. You can now view your medical record.   Additional Information If you have questions, you can email [email or web address] or call [phone] to talk to our [facility] staff. Remember, MyChart is NOT to be used for urgent needs. For medical emergencies, dial 911. Vaginal Bleeding During Pregnancy, First Trimester A small amount of bleeding (spotting) from the vagina is relatively common in early pregnancy. It usually stops on its own. Various things may cause  bleeding or spotting in early pregnancy. Some bleeding may be related to the pregnancy, and some may not. In most cases, the bleeding is normal and is not a problem. However, bleeding can also be a sign of something serious. Be sure to tell your health care provider about any vaginal bleeding right away. Some possible causes of vaginal bleeding during the first trimester include:  Infection or inflammation of the cervix.  Growths (polyps) on the cervix.  Miscarriage or threatened miscarriage.  Pregnancy tissue has developed outside of the uterus and in a fallopian tube (tubal pregnancy).  Tiny cysts have developed in the uterus instead of pregnancy tissue (molar pregnancy). HOME CARE INSTRUCTIONS  Watch your condition for any changes. The following actions may help to lessen any discomfort you are feeling:  Follow your health care provider's instructions for limiting your activity. If your health care provider orders bed rest, you may need to stay in bed and only get up to use the bathroom. However, your health care provider may allow you to continue light activity.  If needed, make plans for someone to help with your regular activities and responsibilities while you are on bed rest.  Keep track of the number of pads you use each day, how often you change pads, and how soaked (saturated) they are. Write this down.  Do not use tampons. Do not douche.  Do not have sexual intercourse or orgasms until approved by your health care provider.  If you pass any tissue from your vagina, save the tissue so you can show  it to your health care provider.  Only take over-the-counter or prescription medicines as directed by your health care provider.  Do not take aspirin because it can make you bleed.  Keep all follow-up appointments as directed by your health care provider. SEEK MEDICAL CARE IF:  You have any vaginal bleeding during any part of your pregnancy.  You have cramps or labor  pains.  You have a fever, not controlled by medicine. SEEK IMMEDIATE MEDICAL CARE IF:   You have severe cramps in your back or belly (abdomen).  You pass large clots or tissue from your vagina.  Your bleeding increases.  You feel light-headed or weak, or you have fainting episodes.  You have chills.  You are leaking fluid or have a gush of fluid from your vagina.  You pass out while having a bowel movement. MAKE SURE YOU:  Understand these instructions.  Will watch your condition.  Will get help right away if you are not doing well or get worse.   This information is not intended to replace advice given to you by your health care provider. Make sure you discuss any questions you have with your health care provider.   Document Released: 10/03/2004 Document Revised: 12/29/2012 Document Reviewed: 08/31/2012 Elsevier Interactive Patient Education [employer].

## 2014-10-21 ENCOUNTER — Telehealth: Payer: Self-pay | Admitting: *Deleted

## 2014-10-21 LAB — BETA HCG QUANT (REF LAB): hCG Quant: 63 m[IU]/mL

## 2014-10-21 NOTE — Telephone Encounter (Signed)
Notified pt of results 

## 2014-10-21 NOTE — Telephone Encounter (Signed)
-----   Message from Evonnie Pat, [place] sent at 10/21/2014  8:08 AM EDT ----- Please let her know her levels, they look good, to keep scheduled appt for u/s

## 2014-10-31 ENCOUNTER — Other Ambulatory Visit: Payer: Self-pay | Admitting: Obstetrics and Gynecology

## 2014-10-31 LAB — BETA HCG QUANT (REF LAB): hCG Quant: 135 m[IU]/mL

## 2014-11-02 ENCOUNTER — Other Ambulatory Visit: Payer: Medicaid Other

## 2014-11-02 ENCOUNTER — Encounter: Payer: Medicaid Other | Admitting: Obstetrics and Gynecology

## 2014-11-03 ENCOUNTER — Emergency Department
Admission: EM | Admit: 2014-11-03 | Discharge: 2014-11-03 | Disposition: A | Discharge status: Home / Self Care | Payer: Medicaid Other | Attending: Emergency Medicine | Admitting: Emergency Medicine

## 2014-11-03 ENCOUNTER — Emergency Department: Payer: Medicaid Other

## 2014-11-03 ENCOUNTER — Other Ambulatory Visit: Payer: Medicaid Other

## 2014-11-03 ENCOUNTER — Other Ambulatory Visit: Payer: Self-pay | Admitting: Obstetrics and Gynecology

## 2014-11-03 ENCOUNTER — Telehealth: Payer: Self-pay | Admitting: *Deleted

## 2014-11-03 DIAGNOSIS — O209 Hemorrhage in early pregnancy, unspecified: Secondary | ICD-10-CM | POA: Diagnosis present

## 2014-11-03 DIAGNOSIS — N939 Abnormal uterine and vaginal bleeding, unspecified: Secondary | ICD-10-CM

## 2014-11-03 DIAGNOSIS — Z3A01 Less than 8 weeks gestation of pregnancy: Secondary | ICD-10-CM | POA: Diagnosis not present

## 2014-11-03 DIAGNOSIS — Z79899 Other long term (current) drug therapy: Secondary | ICD-10-CM | POA: Diagnosis not present

## 2014-11-03 LAB — BETA HCG QUANT (REF LAB): hCG Quant: 28 m[IU]/mL

## 2014-11-03 LAB — CHLAMYDIA/NGC RT PCR (ARMC ONLY)
Chlamydia Tr: NOT DETECTED
N gonorrhoeae: NOT DETECTED

## 2014-11-03 LAB — HCG, QUANTITATIVE, PREGNANCY: hCG, Beta Chain, Quant, S: 34 m[IU]/mL — ABNORMAL HIGH (ref <5)

## 2014-11-03 LAB — WET PREP, GENITAL
Clue Cells Wet Prep HPF POC: NONE SEEN
Trich, Wet Prep: NONE SEEN
Yeast Wet Prep HPF POC: NONE SEEN

## 2014-11-03 NOTE — ED Provider Notes (Signed)
[facility] Emergency Department Provider Note  ____________________________________________  Time seen: Approximately 12:40 PM  I have reviewed the triage vital signs and the nursing notes.   HISTORY  Chief Complaint Vaginal Bleeding    HPI Krystal Harrison is a 29 y.o. female patient reports she's been spotting for about 2 weeks however she woke up this morning with heavier bleeding although not as heavy as a regular period since she had no clots. Patient reports some crampy pain with it as well. Patient was seen recently did not have a IUP visualized on ultrasound. Her hCG was also quite low. [facility]   Past Medical History  Diagnosis Date  . Herpes genitalia     last outbreak 1-2 yrs ago    There are no active problems to display for this patient.   Past Surgical History  Procedure Laterality Date  . Eye surgery      Current Outpatient Rx  Name  Route  Sig  Dispense  Refill  . Prenatal Vit-Fe Fumarate-FA (PRENATAL MULTIVITAMIN) TABS   Oral   Take 1 tablet by mouth daily.           Allergies Review of patient's allergies indicates no known allergies.  Family History  Problem Relation Age of Onset  . Anesthesia problems Neg Hx   . Diabetes Mother     Social History Social History  Substance Use Topics  . Smoking status: Never Smoker   . Smokeless tobacco: Not on file  . Alcohol Use: No    Review of Systems Constitutional: No fever/chills Eyes: No visual changes. ENT: No sore throat. Cardiovascular: Denies chest pain. Respiratory: Denies shortness of breath. Gastrointestinal: No nausea, no vomiting.  No diarrhea.  No constipation. Genitourinary: Negative for dysuria. Musculoskeletal: Negative for back pain. Skin: Negative for rash.  10-point ROS otherwise negative.  ____________________________________________   PHYSICAL EXAM:  VITAL SIGNS: ED Triage Vitals  Enc Vitals Group     BP 11/03/14  0901 122/75 mmHg     Pulse Rate 11/03/14 0901 80     Resp 11/03/14 0901 20     Temp 11/03/14 0901 98 F (36.7 C)     Temp Source 11/03/14 0901 Oral     SpO2 11/03/14 0901 100 %     Weight 11/03/14 0901 190 lb (86.183 kg)     Height 11/03/14 0901 5\' 5"  (1.651 m)     Head Cir --      Peak Flow --      Pain Score --      Pain Loc --      Pain Edu? --      Excl. in [place]? --     Constitutional: Alert and oriented. Well appearing and in no acute distress. Eyes: Conjunctivae are normal. PERRL. EOMI. Head: Atraumatic. Nose: No congestion/rhinnorhea. Mouth/Throat: Mucous membranes are moist.  Oropharynx non-erythematous. Neck: No stridor Cardiovascular: Normal rate, regular rhythm. Grossly normal heart sounds.  Good peripheral circulation. Respiratory: Normal respiratory effort.  No retractions. Lungs CTAB. Gastrointestinal: Soft and nontender. No distention. No abdominal bruits. No CVA tenderness. Genitourinary: Pelvic exam: Normal perineum: Small amount of dark blood in the vagina no active bleeding. There is no adnexal tenderness or cervical motion tenderness. There are no masses palpated. Musculoskeletal: No lower extremity tenderness nor edema.  No joint effusions. Neurologic:  Normal speech and language. No gross focal neurologic deficits are appreciated. No gait instability. Skin:  Skin is warm, dry and intact. No rash noted.  Psychiatric: Mood and affect are normal. Speech and behavior are normal.  ____________________________________________   LABS (all labs ordered are listed, but only abnormal results are displayed)  Labs Reviewed  WET PREP, GENITAL - Abnormal; Notable for the following:    WBC, Wet Prep HPF POC RARE (*)    All other components within normal limits  HCG, QUANTITATIVE, PREGNANCY - Abnormal; Notable for the following:    hCG, Beta Chain, Quant, S 34 (*)    All other components within normal limits  CHLAMYDIA/NGC RT PCR ([facility] ONLY)  CBC WITH  DIFFERENTIAL/PLATELET  BASIC METABOLIC PANEL   ____________________________________________  EKG   ____________________________________________  RADIOLOGY   ____________________________________________   PROCEDURES    ____________________________________________   INITIAL IMPRESSION / ASSESSMENT AND PLAN / ED COURSE  Pertinent labs & imaging results that were available during my care of the patient were reviewed by me and considered in my medical decision making (see chart for details). Discussed with Dr. Marcelline Mates from incompetence. Patient is not having any heavy bleeding and is clinically stable I will let her go now he did not don't have the blood work back except for the [place] which is dropped. Dr. Marcelline Mates will follow her up in incompetence  ____________________________________________   FINAL CLINICAL IMPRESSION(S) / ED DIAGNOSES  Final diagnoses:  Vaginal bleeding      Nena Polio, MD 11/03/14 1426

## 2014-11-03 NOTE — ED Notes (Signed)
Patient transported to Ultrasound 

## 2014-11-03 NOTE — ED Notes (Signed)
Pt reports 6-[redacted] weeks pregnant. Was seen here a few weeks ago for spotting but is not bleeding since this am. Pt denies pain. Pt denies other sx's.

## 2014-11-03 NOTE — Telephone Encounter (Signed)
Notified pt of results 

## 2014-11-03 NOTE — ED Notes (Signed)
Pt denies cramping.

## 2014-11-03 NOTE — ED Notes (Signed)
Patient is resting comfortably. 

## 2014-11-03 NOTE — Telephone Encounter (Signed)
-----   Message from Evonnie Pat, [place] sent at 11/01/2014 10:10 AM EDT ----- Please let her know it is still too early for viability scan- to move it out 1 week (OK to do while I am not here), and repeat her Qaunt  This thursday

## 2014-11-09 ENCOUNTER — Other Ambulatory Visit: Payer: Medicaid Other

## 2014-11-09 DIAGNOSIS — O039 Complete or unspecified spontaneous abortion without complication: Secondary | ICD-10-CM

## 2014-11-10 LAB — BETA HCG QUANT (REF LAB): hCG Quant: <1 m[IU]/mL

## 2014-11-15 ENCOUNTER — Telehealth: Payer: Self-pay | Admitting: *Deleted

## 2014-11-15 NOTE — Telephone Encounter (Signed)
Notified pt of lab results, she voiced understanding.

## 2014-11-15 NOTE — Telephone Encounter (Signed)
-----   Message from Evonnie Pat, [place] sent at 11/15/2014  1:55 AM EST ----- Please let her know quant Bhcg is now negative, should see normal menses within four weeks

## 2015-01-01 DIAGNOSIS — O9989 Other specified diseases and conditions complicating pregnancy, childbirth and the puerperium: Secondary | ICD-10-CM | POA: Diagnosis present

## 2015-01-01 DIAGNOSIS — Z3A01 Less than 8 weeks gestation of pregnancy: Secondary | ICD-10-CM | POA: Insufficient documentation

## 2015-01-01 DIAGNOSIS — R1030 Lower abdominal pain, unspecified: Secondary | ICD-10-CM | POA: Diagnosis not present

## 2015-01-01 NOTE — ED Notes (Addendum)
Pt presents to ED with generalized abd pain that is sharp in nature. Pt reports the pain increases with movement and seems to improve slightly when lying down. Denies n/v/d. Denies similar symptoms previously. Pt alert and calm at this time with no increased work of breathing or acute distress noted at this time. Skin warm and Dry. Pt states she had a miscarriage in October; last week she took pregnancy test and it was positive. Pt states she has no idea how far along she might be in this current pregnancy. No bleeding or spotting currently.

## 2015-01-02 ENCOUNTER — Emergency Department
Admission: EM | Admit: 2015-01-02 | Discharge: 2015-01-02 | Disposition: A | Discharge status: Home / Self Care | Payer: Medicaid Other | Attending: Emergency Medicine | Admitting: Emergency Medicine

## 2015-01-02 ENCOUNTER — Emergency Department: Payer: Medicaid Other

## 2015-01-02 ENCOUNTER — Encounter: Payer: Self-pay | Admitting: Emergency Medicine

## 2015-01-02 DIAGNOSIS — Z3490 Encounter for supervision of normal pregnancy, unspecified, unspecified trimester: Secondary | ICD-10-CM

## 2015-01-02 DIAGNOSIS — R1030 Lower abdominal pain, unspecified: Secondary | ICD-10-CM

## 2015-01-02 LAB — URINALYSIS COMPLETE WITH MICROSCOPIC (ARMC ONLY)
Bacteria, UA: NONE SEEN
Bilirubin Urine: NEGATIVE
Glucose, UA: NEGATIVE mg/dL
Ketones, ur: NEGATIVE mg/dL
Nitrite: NEGATIVE
Protein, ur: NEGATIVE mg/dL
Specific Gravity, Urine: 1.024 (ref 1.005–1.030)
pH: 5 (ref 5.0–8.0)

## 2015-01-02 LAB — CBC WITH DIFFERENTIAL/PLATELET
Basophils Absolute: 0 10*3/uL (ref 0–0.1)
Basophils Relative: 0 %
Eosinophils Absolute: 0.2 10*3/uL (ref 0–0.7)
Eosinophils Relative: 1 %
HCT: 38.9 % (ref 35.0–47.0)
Hemoglobin: 13 g/dL (ref 12.0–16.0)
Lymphocytes Relative: 30 %
Lymphs Abs: 3.3 10*3/uL (ref 1.0–3.6)
MCH: 28.2 pg (ref 26.0–34.0)
MCHC: 33.4 g/dL (ref 32.0–36.0)
MCV: 84.4 fL (ref 80.0–100.0)
Monocytes Absolute: 1.1 10*3/uL — ABNORMAL HIGH (ref 0.2–0.9)
Monocytes Relative: 11 %
Neutro Abs: 6.2 10*3/uL (ref 1.4–6.5)
Neutrophils Relative %: 58 %
Platelets: 223 10*3/uL (ref 150–440)
RBC: 4.6 MIL/uL (ref 3.80–5.20)
RDW: 15.2 % — ABNORMAL HIGH (ref 11.5–14.5)
WBC: 10.7 10*3/uL (ref 3.6–11.0)

## 2015-01-02 LAB — BASIC METABOLIC PANEL
Anion gap: 4 — ABNORMAL LOW (ref 5–15)
BUN: 12 mg/dL (ref 6–20)
CO2: 27 mmol/L (ref 22–32)
Calcium: 9.4 mg/dL (ref 8.9–10.3)
Chloride: 109 mmol/L (ref 101–111)
Creatinine, Ser: 0.62 mg/dL (ref 0.44–1.00)
GFR calc Af Amer: >60 mL/min (ref >60)
GFR calc non Af Amer: >60 mL/min (ref >60)
Glucose, Bld: 88 mg/dL (ref 65–99)
Potassium: 3.6 mmol/L (ref 3.5–5.1)
Sodium: 140 mmol/L (ref 135–145)

## 2015-01-02 LAB — CHLAMYDIA/NGC RT PCR (ARMC ONLY)
Chlamydia Tr: NOT DETECTED
N gonorrhoeae: NOT DETECTED

## 2015-01-02 LAB — WET PREP, GENITAL
Clue Cells Wet Prep HPF POC: NONE SEEN
Sperm: NONE SEEN
Trich, Wet Prep: NONE SEEN

## 2015-01-02 LAB — HCG, QUANTITATIVE, PREGNANCY: hCG, Beta Chain, Quant, S: 4878 m[IU]/mL — ABNORMAL HIGH (ref <5)

## 2015-01-02 LAB — POCT PREGNANCY, URINE: Preg Test, Ur: POSITIVE — AB

## 2015-01-02 NOTE — Discharge Instructions (Signed)
Abdominal Pain, Adult Many things can cause abdominal pain. Usually, abdominal pain is not caused by a disease and will improve without treatment. It can often be observed and treated at home. Your health care provider will do a physical exam and possibly order blood tests and X-rays to help determine the seriousness of your pain. However, in many cases, more time must pass before a clear cause of the pain can be found. Before that point, your health care provider may not know if you need more testing or further treatment. HOME CARE INSTRUCTIONS Monitor your abdominal pain for any changes. The following actions may help to alleviate any discomfort you are experiencing:  Only take over-the-counter or prescription medicines as directed by your health care provider.  Do not take laxatives unless directed to do so by your health care provider.  Try a clear liquid diet (broth, tea, or water) as directed by your health care provider. Slowly move to a bland diet as tolerated. SEEK MEDICAL CARE IF:  You have unexplained abdominal pain.  You have abdominal pain associated with nausea or diarrhea.  You have pain when you urinate or have a bowel movement.  You experience abdominal pain that wakes you in the night.  You have abdominal pain that is worsened or improved by eating food.  You have abdominal pain that is worsened with eating fatty foods.  You have a fever. SEEK IMMEDIATE MEDICAL CARE IF:  Your pain does not go away within 2 hours.  You keep throwing up (vomiting).  Your pain is felt only in portions of the abdomen, such as the right side or the left lower portion of the abdomen.  You pass bloody or black tarry stools. MAKE SURE YOU:  Understand these instructions.  Will watch your condition.  Will get help right away if you are not doing well or get worse.   This information is not intended to replace advice given to you by your health care provider. Make sure you discuss  any questions you have with your health care provider.   Document Released: 10/03/2004 Document Revised: 09/14/2014 Document Reviewed: 09/02/2012 Elsevier Interactive Patient Education 2016 [place] of Pregnancy The first trimester of pregnancy is from week 1 until the end of week 12 (months 1 through 3). A week after a sperm fertilizes an egg, the egg will implant on the wall of the uterus. This embryo will begin to develop into a baby. Genes from you and your partner are forming the baby. The female genes determine whether the baby is a boy or a girl. At 6-8 weeks, the eyes and face are formed, and the heartbeat can be seen on ultrasound. At the end of 12 weeks, all the baby's organs are formed.  Now that you are pregnant, you will want to do everything you can to have a healthy baby. Two of the most important things are to get good prenatal care and to follow your health care provider's instructions. Prenatal care is all the medical care you receive before the baby's birth. This care will help prevent, find, and treat any problems during the pregnancy and childbirth. BODY CHANGES Your body goes through many changes during pregnancy. The changes vary from woman to woman.   You may gain or lose a couple of pounds at first.  You may feel sick to your stomach (nauseous) and throw up (vomit). If the vomiting is uncontrollable, call your health care provider.  You may tire easily.  You may develop headaches that can be relieved by medicines approved by your health care provider.  You may urinate more often. Painful urination may mean you have a bladder infection.  You may develop heartburn as a result of your pregnancy.  You may develop constipation because certain hormones are causing the muscles that push waste through your intestines to slow down.  You may develop hemorrhoids or swollen, bulging veins (varicose veins).  Your breasts may begin to grow larger and become  tender. Your nipples may stick out more, and the tissue that surrounds them (areola) may become darker.  Your gums may bleed and may be sensitive to brushing and flossing.  Dark spots or blotches (chloasma, mask of pregnancy) may develop on your face. This will likely fade after the baby is born.  Your menstrual periods will stop.  You may have a loss of appetite.  You may develop cravings for certain kinds of food.  You may have changes in your emotions from day to day, such as being excited to be pregnant or being concerned that something may go wrong with the pregnancy and baby.  You may have more vivid and strange dreams.  You may have changes in your hair. These can include thickening of your hair, rapid growth, and changes in texture. Some women also have hair loss during or after pregnancy, or hair that feels dry or thin. Your hair will most likely return to normal after your baby is born. WHAT TO EXPECT AT YOUR PRENATAL VISITS During a routine prenatal visit:  You will be weighed to make sure you and the baby are growing normally.  Your blood pressure will be taken.  Your abdomen will be measured to track your baby's growth.  The fetal heartbeat will be listened to starting around week 10 or 12 of your pregnancy.  Test results from any previous visits will be discussed. Your health care provider may ask you:  How you are feeling.  If you are feeling the baby move.  If you have had any abnormal symptoms, such as leaking fluid, bleeding, severe headaches, or abdominal cramping.  If you are using any tobacco products, including cigarettes, chewing tobacco, and electronic cigarettes.  If you have any questions. Other tests that may be performed during your first trimester include:  Blood tests to find your blood type and to check for the presence of any previous infections. They will also be used to check for low iron levels (anemia) and Rh antibodies. Later in the  pregnancy, blood tests for diabetes will be done along with other tests if problems develop.  Urine tests to check for infections, diabetes, or protein in the urine.  An ultrasound to confirm the proper growth and development of the baby.  An amniocentesis to check for possible genetic problems.  Fetal screens for spina bifida and Down syndrome.  You may need other tests to make sure you and the baby are doing well.  HIV (human immunodeficiency virus) testing. Routine prenatal testing includes screening for HIV, unless you choose not to have this test. HOME CARE INSTRUCTIONS  Medicines  Follow your health care provider's instructions regarding medicine use. Specific medicines may be either safe or unsafe to take during pregnancy.  Take your prenatal vitamins as directed.  If you develop constipation, try taking a stool softener if your health care provider approves. Diet  Eat regular, well-balanced meals. Choose a variety of foods, such as meat or vegetable-based protein, fish, milk and low-fat  dairy products, vegetables, fruits, and whole grain breads and cereals. Your health care provider will help you determine the amount of weight gain that is right for you.  Avoid raw meat and uncooked cheese. These carry germs that can cause birth defects in the baby.  Eating four or five small meals rather than three large meals a day may help relieve nausea and vomiting. If you start to feel nauseous, eating a few soda crackers can be helpful. Drinking liquids between meals instead of during meals also seems to help nausea and vomiting.  If you develop constipation, eat more high-fiber foods, such as fresh vegetables or fruit and whole grains. Drink enough fluids to keep your urine clear or pale yellow. Activity and Exercise  Exercise only as directed by your health care provider. Exercising will help you:  Control your weight.  Stay in shape.  Be prepared for labor and  delivery.  Experiencing pain or cramping in the lower abdomen or low back is a good sign that you should stop exercising. Check with your health care provider before continuing normal exercises.  Try to avoid standing for long periods of time. Move your legs often if you must stand in one place for a long time.  Avoid heavy lifting.  Wear low-heeled shoes, and practice good posture.  You may continue to have sex unless your health care provider directs you otherwise. Relief of Pain or Discomfort  Wear a good support bra for breast tenderness.   Take warm sitz baths to soothe any pain or discomfort caused by hemorrhoids. Use hemorrhoid cream if your health care provider approves.   Rest with your legs elevated if you have leg cramps or low back pain.  If you develop varicose veins in your legs, wear support hose. Elevate your feet for 15 minutes, 3-4 times a day. Limit salt in your diet. Prenatal Care  Schedule your prenatal visits by the twelfth week of pregnancy. They are usually scheduled monthly at first, then more often in the last 2 months before delivery.  Write down your questions. Take them to your prenatal visits.  Keep all your prenatal visits as directed by your health care provider. Safety  Wear your seat belt at all times when driving.  Make a list of emergency phone numbers, including numbers for family, friends, the hospital, and police and fire departments. General Tips  Ask your health care provider for a referral to a local prenatal education class. Begin classes no later than at the beginning of month 6 of your pregnancy.  Ask for help if you have counseling or nutritional needs during pregnancy. Your health care provider can offer advice or refer you to specialists for help with various needs.  Do not use hot tubs, steam rooms, or saunas.  Do not douche or use tampons or scented sanitary pads.  Do not cross your legs for long periods of time.  Avoid  cat litter boxes and soil used by cats. These carry germs that can cause birth defects in the baby and possibly loss of the fetus by miscarriage or stillbirth.  Avoid all smoking, herbs, alcohol, and medicines not prescribed by your health care provider. Chemicals in these affect the formation and growth of the baby.  Do not use any tobacco products, including cigarettes, chewing tobacco, and electronic cigarettes. If you need help quitting, ask your health care provider. You may receive counseling support and other resources to help you quit.  Schedule a dentist appointment. At home,  brush your teeth with a soft toothbrush and be gentle when you floss. SEEK MEDICAL CARE IF:   You have dizziness.  You have mild pelvic cramps, pelvic pressure, or nagging pain in the abdominal area.  You have persistent nausea, vomiting, or diarrhea.  You have a bad smelling vaginal discharge.  You have pain with urination.  You notice increased swelling in your face, hands, legs, or ankles. SEEK IMMEDIATE MEDICAL CARE IF:   You have a fever.  You are leaking fluid from your vagina.  You have spotting or bleeding from your vagina.  You have severe abdominal cramping or pain.  You have rapid weight gain or loss.  You vomit blood or material that looks like coffee grounds.  You are exposed to [place] measles and have never had them.  You are exposed to fifth disease or chickenpox.  You develop a severe headache.  You have shortness of breath.  You have any kind of trauma, such as from a fall or a car accident.   This information is not intended to replace advice given to you by your health care provider. Make sure you discuss any questions you have with your health care provider.   Document Released: 12/18/2000 Document Revised: 01/14/2014 Document Reviewed: 11/03/2012 Elsevier Interactive Patient Education [employer].

## 2015-01-02 NOTE — ED Provider Notes (Signed)
[facility] Emergency Department Provider Note  ____________________________________________  Time seen: Approximately 352 AM  I have reviewed the triage vital signs and the nursing notes.   HISTORY  Chief Complaint Abdominal Pain    HPI Krystal Harrison is a 29 y.o. female who comes into the hospital today with abdominal pain. The patient reports around 10:30 she was cleaning up some Christmas and developed some lower abdominal pain as well as some upper abdominal pain. She reports that currently the pain is gone but it was a sharp pain that lasted for some time. The patient reports that she had a miscarriage in October and had a 2 day period in between but does not remember the last time she had a normal period. The patient reports that she took a pregnancy test last week and found that she was pregnant. The patient reports that with this abdominal pain as well as the pregnancy she wanted to come in and be evaluated. The patient did have some clear vaginal discharge but no foul smell or any other complaints at this time. Her pain currently is a 0 out of 10 in intensity.The patient did not have any vomiting nausea or diarrhea.   Past Medical History  Diagnosis Date  . Herpes genitalia     last outbreak 1-2 yrs ago    There are no active problems to display for this patient.   Past Surgical History  Procedure Laterality Date  . Eye surgery      No current outpatient prescriptions on file.  Allergies Review of patient's allergies indicates no known allergies.  Family History  Problem Relation Age of Onset  . Anesthesia problems Neg Hx   . Diabetes Mother     Social History Social History  Substance Use Topics  . Smoking status: Never Smoker   . Smokeless tobacco: None  . Alcohol Use: Yes    Review of Systems Constitutional: No fever/chills Eyes: No visual changes. ENT: No sore throat. Cardiovascular: Denies chest pain. Respiratory: Denies  shortness of breath. Gastrointestinal:  abdominal pain.  No nausea, no vomiting.  No diarrhea.  No constipation. Genitourinary: Negative for dysuria. Musculoskeletal: Negative for back pain. Skin: Negative for rash. Neurological: Negative for headaches, focal weakness or numbness.  10-point ROS otherwise negative.  ____________________________________________   PHYSICAL EXAM:  VITAL SIGNS: ED Triage Vitals  Enc Vitals Group     BP 01/02/15 0001 119/67 mmHg     Pulse Rate 01/02/15 0001 85     Resp 01/02/15 0001 18     Temp 01/02/15 0001 98.4 F (36.9 C)     Temp Source 01/02/15 0001 Oral     SpO2 01/02/15 0001 100 %     Weight 01/02/15 0001 190 lb (86.183 kg)     Height 01/02/15 0001 5\' 5"  (1.651 m)     Head Cir --      Peak Flow --      Pain Score 01/02/15 0002 4     Pain Loc --      Pain Edu? --      Excl. in [place]? --     Constitutional: Alert and oriented. Well appearing and in no acute distress. Eyes: Conjunctivae are normal. PERRL. EOMI. Head: Atraumatic. Nose: No congestion/rhinnorhea. Mouth/Throat: Mucous membranes are moist.  Oropharynx non-erythematous. Cardiovascular: Normal rate, regular rhythm. Grossly normal heart sounds.  Good peripheral circulation. Respiratory: Normal respiratory effort.  No retractions. Lungs CTAB. Gastrointestinal: Soft and nontender. No distention. Positive bowel sounds Genitourinary: Normal  external genitalia with some mild discharge, no bleeding, no cervical motion tenderness, no adnexal tenderness to palpation. Musculoskeletal: No lower extremity tenderness nor edema.  Neurologic:  Normal speech and language.  Skin:  Skin is warm, dry and intact.  Psychiatric: Mood and affect are normal.   ____________________________________________   LABS (all labs ordered are listed, but only abnormal results are displayed)  Labs Reviewed  WET PREP, GENITAL - Abnormal; Notable for the following:    Yeast Wet Prep HPF POC FEW (*)    WBC,  Wet Prep HPF POC MANY (*)    All other components within normal limits  BASIC METABOLIC PANEL - Abnormal; Notable for the following:    Anion gap 4 (*)    All other components within normal limits  CBC WITH DIFFERENTIAL/PLATELET - Abnormal; Notable for the following:    RDW 15.2 (*)    Monocytes Absolute 1.1 (*)    All other components within normal limits  URINALYSIS COMPLETEWITH MICROSCOPIC ([facility] ONLY) - Abnormal; Notable for the following:    Color, Urine YELLOW (*)    APPearance HAZY (*)    Hgb urine dipstick 1+ (*)    Leukocytes, UA TRACE (*)    Squamous Epithelial / LPF 6-30 (*)    All other components within normal limits  HCG, QUANTITATIVE, PREGNANCY - Abnormal; Notable for the following:    hCG, Beta Chain, Quant, S 4878 (*)    All other components within normal limits  POCT PREGNANCY, URINE - Abnormal; Notable for the following:    Preg Test, Ur POSITIVE (*)    All other components within normal limits  CHLAMYDIA/NGC RT PCR ([facility] ONLY)  POC URINE PREG, ED   ____________________________________________  EKG  None ____________________________________________  RADIOLOGY  Pelvic ultrasound: Single intrauterine gestational sac noted, measuring 6 mm corresponding to a gestational age of [redacted] weeks 2 days. It remains too early to determine an estimated 8 of delivery, no yolk sac or embryo seen. Would perform follow-up pelvic ultrasound in 2 weeks ____________________________________________   PROCEDURES  Procedure(s) performed: None  Critical Care performed: No  ____________________________________________   INITIAL IMPRESSION / ASSESSMENT AND PLAN / ED COURSE  Pertinent labs & imaging results that were available during my care of the patient were reviewed by me and considered in my medical decision making (see chart for details).  The patient is 29 year old female who comes into the hospital today with lower abdominal pain. The patient denies any blood  or significant discharge. The patient found out that she was pregnant last week so she came in for evaluation. Currently the patient's pain is gone.  ----------------------------------------- 5:52 AM on 01/02/2015 -----------------------------------------  The patient has white blood cells and her wet prep but no bacteria. The patient's ultrasound is also showing of 5 week 2 day gestational sac with no fetal pole or yolk sac which is within normal limits at this time. The patient will be discharged to follow-up with [facility]. ____________________________________________   FINAL CLINICAL IMPRESSION(S) / ED DIAGNOSES  Final diagnoses:  Lower abdominal pain  Pregnancy      Loney Hering, MD 01/02/15 [phone]

## 2015-01-08 NOTE — L&D Delivery Note (Signed)
Delivery Note At  0558 a viable and healthy female was delivered via  (Presentation  ROA: ;  ).  APGAR:8 ,9 ; weight  .   Placenta status: delivered intact with 3 vessel Cord:  with the following complications: none  Anesthesia:  none Episiotomy:  none Lacerations:  none Suture Repair: NA Est. Blood Loss (mL):  200  Mom to postpartum.  Baby to Couplet care / Skin to Skin.  Melody [employer], CNM 08/29/2015, 6:15 AM

## 2015-01-13 ENCOUNTER — Ambulatory Visit (INDEPENDENT_AMBULATORY_CARE_PROVIDER_SITE_OTHER): Payer: Medicaid Other | Admitting: Obstetrics and Gynecology

## 2015-01-13 VITALS — BP 122/77 | HR 81 | Wt 194.0 lb

## 2015-01-13 DIAGNOSIS — Z331 Pregnant state, incidental: Secondary | ICD-10-CM

## 2015-01-13 DIAGNOSIS — Z369 Encounter for antenatal screening, unspecified: Secondary | ICD-10-CM

## 2015-01-13 DIAGNOSIS — Z1389 Encounter for screening for other disorder: Secondary | ICD-10-CM

## 2015-01-13 DIAGNOSIS — Z3490 Encounter for supervision of normal pregnancy, unspecified, unspecified trimester: Secondary | ICD-10-CM

## 2015-01-13 DIAGNOSIS — Z36 Encounter for antenatal screening of mother: Secondary | ICD-10-CM

## 2015-01-13 DIAGNOSIS — Z113 Encounter for screening for infections with a predominantly sexual mode of transmission: Secondary | ICD-10-CM

## 2015-01-13 DIAGNOSIS — R638 Other symptoms and signs concerning food and fluid intake: Secondary | ICD-10-CM

## 2015-01-13 DIAGNOSIS — Z3687 Encounter for antenatal screening for uncertain dates: Secondary | ICD-10-CM

## 2015-01-13 MED ORDER — PROMETHAZINE HCL 25 MG PO TABS: 25.0000 mg | ORAL_TABLET | Freq: Four times a day (QID) | ORAL | Status: DC | PRN

## 2015-01-13 NOTE — Progress Notes (Signed)
   Krystal Harrison presents for NOB nurse interview visit. G-5.  P-[id number].  Pregnancy education material explained and given. No cats in the home. NOB labs ordered. TSH/HbgA1c due to Increased BMI, HIV labs and Drug screen were explained optional and she could opt out of tests but did not decline.  PNV encouraged. NT to discuss with provider. Pt. To follow up with provider in 6 weeks for NOB physical. C/O severe nausea, dizziness, light headed, hot flashes. Phenergan ordered. Was hospitalized with a previous pregnancy at 32 wks for leaking fluid, then hospitalized until delivery at 38 wks. Miscarriage 10/2014. No D&C required. All questions answered.  ZIKA EXPOSURE SCREEN:  The patient has not traveled to a [place] Virus endemic area within the past 6 months, nor has she had unprotected sex with a partner who has travelled to a [place] endemic region within the past 6 months. The patient has been advised to notify us if these factors change any time during this current pregnancy, so adequate testing and monitoring can be initiated.

## 2015-01-13 NOTE — Patient Instructions (Signed)

## 2015-01-14 LAB — HIV ANTIBODY (ROUTINE TESTING W REFLEX): HIV Screen 4th Generation wRfx: NONREACTIVE

## 2015-01-14 LAB — CBC WITH DIFFERENTIAL/PLATELET
Basophils Absolute: 0 10*3/uL (ref 0.0–0.2)
Basos: 0 %
EOS (ABSOLUTE): 0.1 10*3/uL (ref 0.0–0.4)
Eos: 1 %
Hematocrit: 37.6 % (ref 34.0–46.6)
Hemoglobin: 12.8 g/dL (ref 11.1–15.9)
Immature Grans (Abs): 0 10*3/uL (ref 0.0–0.1)
Immature Granulocytes: 0 %
Lymphocytes Absolute: 2.1 10*3/uL (ref 0.7–3.1)
Lymphs: 22 %
MCH: 27.9 pg (ref 26.6–33.0)
MCHC: 34 g/dL (ref 31.5–35.7)
MCV: 82 fL (ref 79–97)
Monocytes Absolute: 0.8 10*3/uL (ref 0.1–0.9)
Monocytes: 9 %
Neutrophils Absolute: 6.3 10*3/uL (ref 1.4–7.0)
Neutrophils: 68 %
Platelets: 240 10*3/uL (ref 150–379)
RBC: 4.58 x10E6/uL (ref 3.77–5.28)
RDW: 15.1 % (ref 12.3–15.4)
WBC: 9.3 10*3/uL (ref 3.4–10.8)

## 2015-01-14 LAB — URINALYSIS, ROUTINE W REFLEX MICROSCOPIC
Bilirubin, UA: NEGATIVE
Glucose, UA: NEGATIVE
Ketones, UA: NEGATIVE
Nitrite, UA: NEGATIVE
Specific Gravity, UA: 1.029 (ref 1.005–1.030)
Urobilinogen, Ur: 0.2 mg/dL (ref 0.2–1.0)
pH, UA: 6 (ref 5.0–7.5)

## 2015-01-14 LAB — RUBELLA ANTIBODY, IGM: Rubella IgM: <20 AU/mL (ref 0.0–19.9)

## 2015-01-14 LAB — HEPATITIS B SURFACE ANTIGEN: Hepatitis B Surface Ag: NEGATIVE

## 2015-01-14 LAB — HEMOGLOBIN A1C
Est. average glucose Bld gHb Est-mCnc: 103 mg/dL
Hgb A1c MFr Bld: 5.2 % (ref 4.8–5.6)

## 2015-01-14 LAB — RH TYPE: Rh Factor: POSITIVE

## 2015-01-14 LAB — MICROSCOPIC EXAMINATION: Casts: NONE SEEN /lpf

## 2015-01-14 LAB — RPR: RPR Ser Ql: NONREACTIVE

## 2015-01-14 LAB — ANTIBODY SCREEN: Antibody Screen: NEGATIVE

## 2015-01-14 LAB — TSH: TSH: 2.54 u[IU]/mL (ref 0.450–4.500)

## 2015-01-14 LAB — ABO

## 2015-01-15 LAB — CULTURE, OB URINE

## 2015-01-15 LAB — URINE CULTURE, OB REFLEX

## 2015-01-17 LAB — PAIN MGT SCRN (14 DRUGS), UR
Amphetamine Screen, Ur: NEGATIVE ng/mL
Barbiturate Screen, Ur: NEGATIVE ng/mL
Benzodiazepine Screen, Urine: NEGATIVE ng/mL
Buprenorphine, Urine: NEGATIVE ng/mL
Cannabinoids Ur Ql Scn: NEGATIVE ng/mL
Cocaine(Metab.)Screen, Urine: NEGATIVE ng/mL
Creatinine(Crt), U: 212.3 mg/dL (ref 20.0–300.0)
Fentanyl, Urine: NEGATIVE pg/mL
Meperidine Screen, Urine: NEGATIVE ng/mL
Methadone Scn, Ur: NEGATIVE ng/mL
Opiate Scrn, Ur: NEGATIVE ng/mL
Oxycodone+Oxymorphone Ur Ql Scn: NEGATIVE ng/mL
PCP Scrn, Ur: NEGATIVE ng/mL
Ph of Urine: 6.1 (ref 4.5–8.9)
Propoxyphene, Screen: NEGATIVE ng/mL
Tramadol Ur Ql Scn: NEGATIVE ng/mL

## 2015-01-17 LAB — GC/CHLAMYDIA PROBE AMP
Chlamydia trachomatis, NAA: NEGATIVE
Neisseria gonorrhoeae by PCR: NEGATIVE

## 2015-01-17 LAB — NICOTINE SCREEN, URINE: Cotinine Ql Scrn, Ur: NEGATIVE ng/mL

## 2015-01-18 ENCOUNTER — Ambulatory Visit (INDEPENDENT_AMBULATORY_CARE_PROVIDER_SITE_OTHER): Payer: Medicaid Other

## 2015-01-18 DIAGNOSIS — Z3490 Encounter for supervision of normal pregnancy, unspecified, unspecified trimester: Secondary | ICD-10-CM

## 2015-01-18 DIAGNOSIS — Z36 Encounter for antenatal screening of mother: Secondary | ICD-10-CM | POA: Diagnosis not present

## 2015-01-18 DIAGNOSIS — Z3687 Encounter for antenatal screening for uncertain dates: Secondary | ICD-10-CM

## 2015-01-18 DIAGNOSIS — Z331 Pregnant state, incidental: Secondary | ICD-10-CM | POA: Diagnosis not present

## 2015-01-18 LAB — VARICELLA ZOSTER ANTIBODY, IGM: Varicella IgM: 1.1 index — ABNORMAL HIGH (ref 0.00–0.90)

## 2015-01-20 ENCOUNTER — Other Ambulatory Visit: Payer: Self-pay | Admitting: Internal Medicine

## 2015-01-27 ENCOUNTER — Emergency Department
Admission: EM | Admit: 2015-01-27 | Discharge: 2015-01-28 | Disposition: A | Discharge status: Home / Self Care | Payer: Medicaid Other | Attending: Emergency Medicine | Admitting: Emergency Medicine

## 2015-01-27 ENCOUNTER — Encounter: Payer: Self-pay | Admitting: Emergency Medicine

## 2015-01-27 ENCOUNTER — Emergency Department: Payer: Medicaid Other

## 2015-01-27 DIAGNOSIS — Z79899 Other long term (current) drug therapy: Secondary | ICD-10-CM | POA: Diagnosis not present

## 2015-01-27 DIAGNOSIS — O209 Hemorrhage in early pregnancy, unspecified: Secondary | ICD-10-CM | POA: Diagnosis present

## 2015-01-27 DIAGNOSIS — O468X1 Other antepartum hemorrhage, first trimester: Secondary | ICD-10-CM

## 2015-01-27 DIAGNOSIS — O418X10 Other specified disorders of amniotic fluid and membranes, first trimester, not applicable or unspecified: Secondary | ICD-10-CM | POA: Insufficient documentation

## 2015-01-27 DIAGNOSIS — Z3A09 9 weeks gestation of pregnancy: Secondary | ICD-10-CM | POA: Insufficient documentation

## 2015-01-27 DIAGNOSIS — O200 Threatened abortion: Secondary | ICD-10-CM | POA: Diagnosis not present

## 2015-01-27 LAB — HCG, QUANTITATIVE, PREGNANCY: hCG, Beta Chain, Quant, S: 91642 m[IU]/mL — ABNORMAL HIGH (ref <5)

## 2015-01-27 LAB — ABO/RH: ABO/RH(D): A POS

## 2015-01-27 LAB — CBC
HCT: 36.7 % (ref 35.0–47.0)
Hemoglobin: 12.2 g/dL (ref 12.0–16.0)
MCH: 28.5 pg (ref 26.0–34.0)
MCHC: 33.3 g/dL (ref 32.0–36.0)
MCV: 85.4 fL (ref 80.0–100.0)
Platelets: 212 10*3/uL (ref 150–440)
RBC: 4.3 MIL/uL (ref 3.80–5.20)
RDW: 15 % — ABNORMAL HIGH (ref 11.5–14.5)
WBC: 12.8 10*3/uL — ABNORMAL HIGH (ref 3.6–11.0)

## 2015-01-27 NOTE — ED Notes (Signed)
Patient reports she is approximately [redacted] weeks pregnant and around 9 pm she started having vaginal bleeding.   Also reports hot/cold flashes.

## 2015-01-27 NOTE — ED Notes (Signed)
Pelvic exam performed by Dr Kerman Passey, this RN present.  Pt tolerated well.

## 2015-01-27 NOTE — ED Provider Notes (Signed)
[facility] Emergency Department Provider Note  Time seen: 10:21 PM  I have reviewed the triage vital signs and the nursing notes.   HISTORY  Chief Complaint Vaginal Bleeding    HPI Krystal Harrison is a 30 y.o. female G5 P3 A1 with no past medical history who presents to the emergency department approximately [redacted] weeks pregnant with vaginal bleeding. According to the patient she was seen in the emergency department 3 weeks ago weeks ago,at that time to the positive pregnancy test but a negative ultrasound. Patient has seen in [facility], but does not have another appointment for 2 weeks. States today she was using the bathroom when she noted a mild amount of vaginal bleeding with a small blood clot. Denies any abdominal pain. Denies abdominal cramping. Denies fever. Patient was concerned that she has had a miscarriage in the past so she came to the emergency department for evaluation.    Past Medical History  Diagnosis Date  . Herpes genitalia     last outbreak 1-2 yrs ago    There are no active problems to display for this patient.   Past Surgical History  Procedure Laterality Date  . Eye surgery    . Eye surgery Bilateral     Current Outpatient Rx  Name  Route  Sig  Dispense  Refill  . Prenatal Vit-Fe Fumarate-FA (PRENATAL MULTIVITAMIN) TABS tablet   Oral   Take 1 tablet by mouth daily at 12 noon.         . promethazine (PHENERGAN) 25 MG tablet   Oral   Take 1 tablet (25 mg total) by mouth every 6 (six) hours as needed for nausea or vomiting.   30 tablet   1   . metroNIDAZOLE (METROGEL) 0.75 % vaginal gel      APPLY PER VAGINA NIGHTLY FOR 5 NIGHTS FOLLOWED BY 2 TIMES A WEEK FOR 3-6 MONTHS FOR SUPRESSION      5     Allergies Review of patient's allergies indicates no known allergies.  Family History  Problem Relation Age of Onset  . Anesthesia problems Neg Hx   . Diabetes Mother     Social History Social History  Substance  Use Topics  . Smoking status: Never Smoker   . Smokeless tobacco: Never Used  . Alcohol Use: None    Review of Systems Constitutional: Negative for fever. Cardiovascular: Negative for chest pain. Respiratory: Negative for shortness of breath. Gastrointestinal: Negative for abdominal pain Genitourinary: Negative for dysuria. Positive for vaginal bleeding. Neurological: Negative for headache 10-point ROS otherwise negative.  ____________________________________________   PHYSICAL EXAM:  VITAL SIGNS: ED Triage Vitals  Enc Vitals Group     BP 01/27/15 2159 127/69 mmHg     Pulse Rate 01/27/15 2159 87     Resp 01/27/15 2159 20     Temp 01/27/15 2159 98.3 F (36.8 C)     Temp Source 01/27/15 2159 Oral     SpO2 01/27/15 2159 97 %     Weight 01/27/15 2159 195 lb (88.451 kg)     Height 01/27/15 2159 5\' 5"  (1.651 m)     Head Cir --      Peak Flow --      Pain Score 01/27/15 2159 0     Pain Loc --      Pain Edu? --      Excl. in [place]? --     Constitutional: Alert and oriented. Well appearing and in no distress. Eyes: Normal exam  ENT   Head: Normocephalic and atraumatic   Mouth/Throat: Mucous membranes are moist. Cardiovascular: Normal rate, regular rhythm. No murmur Respiratory: Normal respiratory effort without tachypnea nor retractions. Breath sounds are clear and equal bilaterally. No wheezes/rales/rhonchi. Gastrointestinal: Soft and nontender. No distention.  Musculoskeletal: Nontender with normal range of motion in all extremities.  Neurologic:  Normal speech and language. No gross focal neurologic deficits  Skin:  Skin is warm, dry and intact.  Psychiatric: Mood and affect are normal. Speech and behavior are normal. ____________________________________________   RADIOLOGY  Ultrasound pending  ____________________________________________    INITIAL IMPRESSION / ASSESSMENT AND PLAN / ED COURSE  Pertinent labs & imaging results that were available during my  care of the patient were reviewed by me and considered in my medical decision making (see chart for details).  Patient presents the emergency department with vaginal bleeding approximately [redacted] weeks pregnant. We will check labs, perform pelvic exam, and obtain an ultrasound as the patient's ultrasound on 12/26 did not show an IUP. Overall the patient appears very well, nontender exam.  Pelvic exam shows very small amount of bleeding from the cervical os. Closed os. No other findings.  Labs largely within normal limits. Beta-hCG pending. Ultrasound pending. Patient care signed out to Dr. Karma Greaser.  ____________________________________________   FINAL CLINICAL IMPRESSION(S) / ED DIAGNOSES  Threatened miscarriage   Harvest Dark, MD 01/27/15 [phone]

## 2015-01-28 NOTE — ED Notes (Signed)
Pt discharged to home.  Discharge instructions reviewed.  Verbalized understanding.  No questions or concerns at this time.  Teach back verified.  Pt in NAD.  No items left in ED.   

## 2015-01-28 NOTE — Discharge Instructions (Signed)
Please follow-up with OB/GYN as soon as possible for repeat beta hCG/pregnancy hormone testing within the next 2-3 days.  As we discussed, you have a small amount of subchorionic hemorrhage, and you can call your OB clinic and tell them about it - see if they feel you need a follow up visit before your appointment in February.   Threatened Miscarriage A threatened miscarriage occurs when you have vaginal bleeding during your first 20 weeks of pregnancy but the pregnancy has not ended. If you have vaginal bleeding during this time, your health care provider will do tests to make sure you are still pregnant. If the tests show you are still pregnant and the developing baby (fetus) inside your womb (uterus) is still growing, your condition is considered a threatened miscarriage. A threatened miscarriage does not mean your pregnancy will end, but it does increase the risk of losing your pregnancy (complete miscarriage). CAUSES  The cause of a threatened miscarriage is usually not known. If you go on to have a complete miscarriage, the most common cause is an abnormal number of chromosomes in the developing baby. Chromosomes are the structures inside cells that hold all your genetic material. Some causes of vaginal bleeding that do not result in miscarriage include:  Having sex.  Having an infection.  Normal hormone changes of pregnancy.  Bleeding that occurs when an egg implants in your uterus. RISK FACTORS Risk factors for bleeding in early pregnancy include:  Obesity.  Smoking.  Drinking excessive amounts of alcohol or caffeine.  Recreational drug use. SIGNS AND SYMPTOMS  Light vaginal bleeding.  Mild abdominal pain or cramps. DIAGNOSIS  If you have bleeding with or without abdominal pain before 20 weeks of pregnancy, your health care provider will do tests to check whether you are still pregnant. One important test involves using sound waves and a computer (ultrasound) to create images  of the inside of your uterus. Other tests include an internal exam of your vagina and uterus (pelvic exam) and measurement of your baby's heart rate.  You may be diagnosed with a threatened miscarriage if:  Ultrasound testing shows you are still pregnant.  Your baby's heart rate is strong.  A pelvic exam shows that the opening between your uterus and your vagina (cervix) is closed.  Your heart rate and blood pressure are stable.  Blood tests confirm you are still pregnant. TREATMENT  No treatments have been shown to prevent a threatened miscarriage from going on to a complete miscarriage. However, the right home care is important.  HOME CARE INSTRUCTIONS   Make sure you keep all your appointments for prenatal care. This is very important.  Get plenty of rest.  Do not have sex or use tampons if you have vaginal bleeding.  Do not douche.  Do not smoke or use recreational drugs.  Do not drink alcohol.  Avoid caffeine. SEEK MEDICAL CARE IF:  You have light vaginal bleeding or spotting while pregnant.  You have abdominal pain or cramping.  You have a fever. SEEK IMMEDIATE MEDICAL CARE IF:  You have heavy vaginal bleeding.  You have blood clots coming from your vagina.  You have severe low back pain or abdominal cramps.  You have fever, chills, and severe abdominal pain. MAKE SURE YOU:  Understand these instructions.  Will watch your condition.  Will get help right away if you are not doing well or get worse.   This information is not intended to replace advice given to you by your health  care provider. Make sure you discuss any questions you have with your health care provider.   Document Released: 12/24/2004 Document Revised: 12/29/2012 Document Reviewed: 10/20/2012 Elsevier Interactive Patient Education 2016 [place] Hematoma A subchorionic hematoma is a gathering of blood between the outer wall of the placenta and the inner wall of the  womb (uterus). The placenta is the organ that connects the fetus to the wall of the uterus. The placenta performs the feeding, breathing (oxygen to the fetus), and waste removal (excretory work) of the fetus.  Subchorionic hematoma is the most common abnormality found on a result from ultrasonography done during the first trimester or early second trimester of pregnancy. If there has been little or no vaginal bleeding, early small hematomas usually shrink on their own and do not affect your baby or pregnancy. The blood is gradually absorbed over 1-2 weeks. When bleeding starts later in pregnancy or the hematoma is larger or occurs in an older pregnant woman, the outcome may not be as good. Larger hematomas may get bigger, which increases the chances for miscarriage. Subchorionic hematoma also increases the risk of premature detachment of the placenta from the uterus, preterm (premature) labor, and stillbirth. HOME CARE INSTRUCTIONS  Stay on bed rest if your health care provider recommends this. Although bed rest will not prevent more bleeding or prevent a miscarriage, your health care provider may recommend bed rest until you are advised otherwise.  Avoid heavy lifting (more than 10 lb [4.5 kg]), exercise, sexual intercourse, or douching as directed by your health care provider.  Keep track of the number of pads you use each day and how soaked (saturated) they are. Write down this information.  Do not use tampons.  Keep all follow-up appointments as directed by your health care provider. Your health care provider may ask you to have follow-up blood tests or ultrasound tests or both. SEEK IMMEDIATE MEDICAL CARE IF:  You have severe cramps in your stomach, back, abdomen, or pelvis.  You have a fever.  You pass large clots or tissue. Save any tissue for your health care provider to look at.  Your bleeding increases or you become lightheaded, feel weak, or have fainting episodes.   This  information is not intended to replace advice given to you by your health care provider. Make sure you discuss any questions you have with your health care provider.   Document Released: 04/10/2006 Document Revised: 01/14/2014 Document Reviewed: 07/23/2012 Elsevier Interactive Patient Education [employer].

## 2015-01-28 NOTE — ED Provider Notes (Signed)
----------------------------------------- 11:40 PM on 01/27/2015 -----------------------------------------   Blood pressure 127/69, pulse 87, temperature 98.3 F (36.8 C), temperature source Oral, resp. rate 20, height 5\' 5"  (1.651 m), weight 88.451 kg, last menstrual period 12/01/2014, SpO2 97 %.  Assuming care from Dr. Kerman Passey.  In short, Krystal Harrison is a 30 y.o. female with a chief complaint of Vaginal Bleeding .  Refer to the original H&P for additional details.  The current plan of care is to follow up ultrasound results and disposition appropriately.  ----------------------------------------- 12:41 AM on 01/28/2015 -----------------------------------------  Ultrasound shows a small subchorionic hemorrhage.  I discussed this with the patient and advised close outpatient follow-up but provided reassurance in general.  She understands the importance of follow-up.  I gave my usual and customary return precautions.     US Ob Comp Less 14 Wks  01/28/2015  CLINICAL DATA:  Acute onset of vaginal bleeding.  Initial encounter. EXAM: OBSTETRIC <14 WK [place] AND TRANSVAGINAL OB US TECHNIQUE: Both transabdominal and transvaginal ultrasound examinations were performed for complete evaluation of the gestation as well as the maternal uterus, adnexal regions, and pelvic cul-de-sac. Transvaginal technique was performed to assess early pregnancy. COMPARISON:  Pelvic ultrasound performed 01/02/2015 FINDINGS: Intrauterine gestational sac: Visualized/normal in shape. Yolk sac:  Yes Embryo:  Yes Cardiac Activity: Yes Heart Rate: 175  bpm CRL:  2.0 cm   8 w   4 d                  [place] EDC: 09/04/2015 Subchorionic hemorrhage: A small amount of subchorionic hemorrhage is noted, measuring 2.0 x 0.5 x 1.6 cm. Maternal uterus/adnexae: The ovaries are grossly unremarkable in appearance. The right ovary measures 2.3 x 1.6 x 2.1 cm, while the left ovary measures 3.3 x 2.3 x 2.6 cm. No suspicious adnexal masses are  seen; there is no evidence for ovarian torsion. No free fluid is seen within the pelvic cul-de-sac. IMPRESSION: 1. Single live intrauterine pregnancy noted, with a crown-rump length of 2.0 cm, corresponding to a gestational age of [redacted] weeks 4 days. This matches the gestational age of [redacted] weeks 1 day by LMP, reflecting an estimated date of delivery of September 07, 2015. 2. Small amount of subchorionic hemorrhage noted. Electronically Signed   By: Garald Balding M.D.   On: 01/28/2015 00:29   US Ob Transvaginal  01/28/2015  CLINICAL DATA:  Acute onset of vaginal bleeding.  Initial encounter. EXAM: OBSTETRIC <14 WK [place] AND TRANSVAGINAL OB US TECHNIQUE: Both transabdominal and transvaginal ultrasound examinations were performed for complete evaluation of the gestation as well as the maternal uterus, adnexal regions, and pelvic cul-de-sac. Transvaginal technique was performed to assess early pregnancy. COMPARISON:  Pelvic ultrasound performed 01/02/2015 FINDINGS: Intrauterine gestational sac: Visualized/normal in shape. Yolk sac:  Yes Embryo:  Yes Cardiac Activity: Yes Heart Rate: 175  bpm CRL:  2.0 cm   8 w   4 d                  [place] EDC: 09/04/2015 Subchorionic hemorrhage: A small amount of subchorionic hemorrhage is noted, measuring 2.0 x 0.5 x 1.6 cm. Maternal uterus/adnexae: The ovaries are grossly unremarkable in appearance. The right ovary measures 2.3 x 1.6 x 2.1 cm, while the left ovary measures 3.3 x 2.3 x 2.6 cm. No suspicious adnexal masses are seen; there is no evidence for ovarian torsion. No free fluid is seen within the pelvic cul-de-sac. IMPRESSION: 1. Single live intrauterine pregnancy noted, with a crown-rump length  of 2.0 cm, corresponding to a gestational age of [redacted] weeks 4 days. This matches the gestational age of [redacted] weeks 1 day by LMP, reflecting an estimated date of delivery of September 07, 2015. 2. Small amount of subchorionic hemorrhage noted. Electronically Signed   By: Garald Balding M.D.   On:  01/28/2015 00:29      Hinda Kehr, MD 01/28/15 [phone]

## 2015-01-31 ENCOUNTER — Encounter: Payer: Self-pay | Admitting: Obstetrics and Gynecology

## 2015-01-31 ENCOUNTER — Ambulatory Visit (INDEPENDENT_AMBULATORY_CARE_PROVIDER_SITE_OTHER): Payer: Medicaid Other | Admitting: Obstetrics and Gynecology

## 2015-01-31 VITALS — BP 105/78 | HR 81 | Wt 196.0 lb

## 2015-01-31 DIAGNOSIS — K5900 Constipation, unspecified: Secondary | ICD-10-CM | POA: Diagnosis not present

## 2015-01-31 DIAGNOSIS — Z331 Pregnant state, incidental: Secondary | ICD-10-CM

## 2015-01-31 DIAGNOSIS — K5909 Other constipation: Secondary | ICD-10-CM | POA: Insufficient documentation

## 2015-01-31 LAB — POCT URINALYSIS DIPSTICK
Bilirubin, UA: NEGATIVE
Glucose, UA: NEGATIVE
Ketones, UA: NEGATIVE
Leukocytes, UA: NEGATIVE
Nitrite, UA: NEGATIVE
Spec Grav, UA: <=1.005
Urobilinogen, UA: 0.2
pH, UA: 8

## 2015-01-31 MED ORDER — POLYETHYLENE GLYCOL 3350 17 GM/SCOOP PO POWD: 1.0000 | Freq: Once | ORAL | Status: DC

## 2015-01-31 NOTE — Progress Notes (Signed)
Work-in: f/u from ED on 1/20 with spotting- found to have subchorionic hemorrhage- denies any heavy bleeding since; is still having constipation and flare up og hemorhoids, only has one BM a day. Added miralax- reviewed subchorionic hemorrhage and will f/u at next appt. No sex until 1 week with no bleeding.

## 2015-01-31 NOTE — Progress Notes (Signed)
NOB- pt went to ER 01/28/15 was told she has sub hemorrage

## 2015-02-22 ENCOUNTER — Other Ambulatory Visit: Payer: Self-pay | Admitting: Obstetrics and Gynecology

## 2015-02-22 ENCOUNTER — Encounter: Payer: Self-pay | Admitting: Obstetrics and Gynecology

## 2015-02-22 ENCOUNTER — Ambulatory Visit (INDEPENDENT_AMBULATORY_CARE_PROVIDER_SITE_OTHER): Payer: Medicaid Other | Admitting: Obstetrics and Gynecology

## 2015-02-22 VITALS — BP 120/70 | HR 82 | Wt 201.1 lb

## 2015-02-22 DIAGNOSIS — K5909 Other constipation: Secondary | ICD-10-CM

## 2015-02-22 DIAGNOSIS — O09899 Supervision of other high risk pregnancies, unspecified trimester: Secondary | ICD-10-CM | POA: Insufficient documentation

## 2015-02-22 DIAGNOSIS — Z283 Underimmunization status: Secondary | ICD-10-CM

## 2015-02-22 DIAGNOSIS — O9989 Other specified diseases and conditions complicating pregnancy, childbirth and the puerperium: Secondary | ICD-10-CM

## 2015-02-22 DIAGNOSIS — Z331 Pregnant state, incidental: Secondary | ICD-10-CM

## 2015-02-22 DIAGNOSIS — K5900 Constipation, unspecified: Secondary | ICD-10-CM

## 2015-02-22 LAB — POCT URINALYSIS DIPSTICK
Bilirubin, UA: NEGATIVE
Blood, UA: NEGATIVE
Glucose, UA: NEGATIVE
Ketones, UA: NEGATIVE
Nitrite, UA: NEGATIVE
Spec Grav, UA: 1.01
Urobilinogen, UA: 0.2
pH, UA: 7.5

## 2015-02-22 NOTE — Progress Notes (Signed)
NEW OB HISTORY AND PHYSICAL  SUBJECTIVE:       Krystal Harrison is a 30 y.o. [phone] female, Patient's last menstrual period was 12/01/2014., Estimated Date of Delivery: 09/07/15, [redacted]w[redacted]d, presents today for establishment of Prenatal Care. She has no unusual complaints and complains of still sees occasional vaginal spotting      Gynecologic History Patient's last menstrual period was 12/01/2014. Normal Contraception: none Last Pap: 2014. Results were: normal  Obstetric History OB History  Gravida Para Term Preterm AB SAB TAB Ectopic Multiple Living  5 3 3  0 1 1 0 0 0 3    # Outcome Date GA Lbr Len/2nd Weight Sex Delivery Anes PTL Lv  5 Current           4 SAB 2016        FD  3 Term 09/12/11 [redacted]w[redacted]d  8 lb 12 oz (3.969 kg) F Stormy Card Y  2 Term 12/31/07 [redacted]w[redacted]d  7 lb 14 oz (3.572 kg) M Vag-Spont  N Y  1 Term 09/07/04 [redacted]w[redacted]d  7 lb 15 oz (3.6 kg) F Vag-Spont   Y    Obstetric Comments  09/12/2011 pt leaking fluid at 32wks, hospitalized until delivery at 38wks.    Past Medical History  Diagnosis Date  . Herpes genitalia     last outbreak 1-2 yrs ago    Past Surgical History  Procedure Laterality Date  . Eye surgery    . Eye surgery Bilateral     Current Outpatient Prescriptions on File Prior to Visit  Medication Sig Dispense Refill  . metroNIDAZOLE (METROGEL) 0.75 % vaginal gel Reported on 02/22/2015  5  . polyethylene glycol powder (GLYCOLAX/MIRALAX) powder Take 255 g by mouth once. To use every third day in no BM occurs spontaneously (Patient not taking: Reported on 02/22/2015) 255 g 1  . Prenatal Vit-Fe Fumarate-FA (PRENATAL MULTIVITAMIN) TABS tablet Take 1 tablet by mouth daily at 12 noon. Reported on 02/22/2015    . promethazine (PHENERGAN) 25 MG tablet Take 1 tablet (25 mg total) by mouth every 6 (six) hours as needed for nausea or vomiting. (Patient not taking: Reported on 01/31/2015) 30 tablet 1   No current facility-administered medications on file prior to visit.    No  Known Allergies  Social History   Social History  . Marital Status: Single    Spouse Name: N/A  . Number of Children: N/A  . Years of Education: N/A   Occupational History  . Not on file.   Social History Main Topics  . Smoking status: Never Smoker   . Smokeless tobacco: Never Used  . Alcohol Use: Not on file  . Drug Use: No  . Sexual Activity: Yes    Birth Control/ Protection: None   Other Topics Concern  . Not on file   Social History Narrative    Family History  Problem Relation Age of Onset  . Anesthesia problems Neg Hx   . Diabetes Mother     The following portions of the patient's history were reviewed and updated as appropriate: allergies, current medications, past OB history, past medical history, past surgical history, past family history, past social history, and problem list.    OBJECTIVE: Initial Physical Exam (New OB)  GENERAL APPEARANCE: alert, well appearing, in no apparent distress, oriented to person, place and time HEAD: normocephalic, atraumatic MOUTH: mucous membranes moist, pharynx normal without lesions THYROID: no thyromegaly or masses present BREASTS: not examined LUNGS: clear to auscultation, no wheezes, rales or rhonchi,  symmetric air entry HEART: regular rate and rhythm, no murmurs ABDOMEN: soft, nontender, nondistended, no abnormal masses, no epigastric pain, obese, fundus not palpable and FHT present EXTREMITIES: no redness or tenderness in the calves or thighs SKIN: normal coloration and turgor, no rashes LYMPH NODES: no adenopathy palpable NEUROLOGIC: alert, oriented, normal speech, no focal findings or movement disorder noted  PELVIC EXAM EXTERNAL GENITALIA: normal appearing vulva with no masses, tenderness or lesions CERVIX: no lesions or cervical motion tenderness UTERUS: gravid and consistent with 12 weeks ADNEXA: no masses palpable and nontender  ASSESSMENT: Normal pregnancy Elevated BMI First trimester spotting  secondary to subchorionic hemorrhage  PLAN: Prenatal care Desires genetic screening- never had CF test in other pregnancies See orders

## 2015-02-22 NOTE — Progress Notes (Signed)
NOB-pt states she saw some blood this am when she wiped, c/o headache

## 2015-02-22 NOTE — Patient Instructions (Signed)

## 2015-02-24 LAB — CYTOLOGY - PAP

## 2015-03-01 ENCOUNTER — Other Ambulatory Visit: Payer: Self-pay | Admitting: Obstetrics and Gynecology

## 2015-03-01 ENCOUNTER — Encounter: Payer: Self-pay | Admitting: Obstetrics and Gynecology

## 2015-03-03 ENCOUNTER — Other Ambulatory Visit: Payer: Self-pay | Admitting: Obstetrics and Gynecology

## 2015-03-03 ENCOUNTER — Telehealth: Payer: Self-pay | Admitting: Obstetrics and Gynecology

## 2015-03-03 MED ORDER — CEFDINIR 300 MG PO CAPS: 300.0000 mg | ORAL_CAPSULE | Freq: Two times a day (BID) | ORAL | Status: DC

## 2015-03-03 NOTE — Telephone Encounter (Signed)
Please let her know I sent in a prescription for antibiotics

## 2015-03-03 NOTE — Telephone Encounter (Signed)
Notified pt. 

## 2015-03-03 NOTE — Telephone Encounter (Signed)
Pt is [redacted] wks pregnant. She has congestion, cough, burns when she coughs and coughing up green stuff. , headache all this for 3 days., I told her she could have sudafed and robitussin. She wanted me to send a note to see if she needed to be seen or she could do something else.

## 2015-03-03 NOTE — Telephone Encounter (Signed)
Any other suggestions?

## 2015-03-05 ENCOUNTER — Encounter: Payer: Self-pay | Admitting: Emergency Medicine

## 2015-03-05 ENCOUNTER — Emergency Department
Admission: EM | Admit: 2015-03-05 | Discharge: 2015-03-05 | Disposition: A | Discharge status: Home / Self Care | Payer: Medicaid Other | Attending: Emergency Medicine | Admitting: Emergency Medicine

## 2015-03-05 ENCOUNTER — Emergency Department: Payer: Medicaid Other

## 2015-03-05 DIAGNOSIS — M79602 Pain in left arm: Secondary | ICD-10-CM | POA: Diagnosis not present

## 2015-03-05 DIAGNOSIS — Z79899 Other long term (current) drug therapy: Secondary | ICD-10-CM | POA: Insufficient documentation

## 2015-03-05 DIAGNOSIS — Z792 Long term (current) use of antibiotics: Secondary | ICD-10-CM | POA: Insufficient documentation

## 2015-03-05 LAB — RAPID INFLUENZA A&B ANTIGENS
Influenza A (ARMC): NOT DETECTED
Influenza B (ARMC): NOT DETECTED

## 2015-03-05 NOTE — ED Provider Notes (Signed)
[facility] Emergency Department Provider Note  ____________________________________________  Time seen: On arrival  I have reviewed the triage vital signs and the nursing notes.   HISTORY  Chief Complaint Shoulder Pain    HPI Krystal Harrison is a 30 y.o. female presents with "moderate aching "in her left upper extremity. Patient reports he kept her up last night and she is unsure what caused it but she does remember picking up a baby swing with her left arm 2 days ago that made her arm sore. She is [redacted] weeks pregnant. She denies shortness of breath. No recent travel. No swelling of the arm. No numbness or tingling of the arm. She does feel pain radiating down her arm into her fingers. She denies neck pain.    Past Medical History  Diagnosis Date  . Herpes genitalia     last outbreak 1-2 yrs ago    Patient Active Problem List   Diagnosis Date Noted  . Rubella non-immune status, antepartum 02/22/2015  . Chronic constipation 01/31/2015    Past Surgical History  Procedure Laterality Date  . Eye surgery    . Eye surgery Bilateral     Current Outpatient Rx  Name  Route  Sig  Dispense  Refill  . cefdinir (OMNICEF) 300 MG capsule   Oral   Take 1 capsule (300 mg total) by mouth 2 (two) times daily.   14 capsule   0   . metroNIDAZOLE (METROGEL) 0.75 % vaginal gel      Reported on 02/22/2015      5   . polyethylene glycol powder (GLYCOLAX/MIRALAX) powder   Oral   Take 255 g by mouth once. To use every third day in no BM occurs spontaneously Patient not taking: Reported on 02/22/2015   255 g   1   . Prenatal Vit-Fe Fumarate-FA (PRENATAL MULTIVITAMIN) TABS tablet   Oral   Take 1 tablet by mouth daily at 12 noon. Reported on 02/22/2015         . promethazine (PHENERGAN) 25 MG tablet   Oral   Take 1 tablet (25 mg total) by mouth every 6 (six) hours as needed for nausea or vomiting. Patient not taking: Reported on 01/31/2015   30 tablet   1      Allergies Review of patient's allergies indicates no known allergies.  Family History  Problem Relation Age of Onset  . Anesthesia problems Neg Hx   . Diabetes Mother     Social History Social History  Substance Use Topics  . Smoking status: Never Smoker   . Smokeless tobacco: Never Used  . Alcohol Use: None    Review of Systems  Constitutional: Negative for fever. Eyes: Negative for visual changes. ENT: Negative for neck pain   Genitourinary: Negative for incontinence Musculoskeletal: Negative for back pain. Skin: Negative for rash. Neurological: Negative for headaches   ____________________________________________   PHYSICAL EXAM:  VITAL SIGNS: ED Triage Vitals  Enc Vitals Group     BP 03/05/15 1102 132/83 mmHg     Pulse Rate 03/05/15 1102 97     Resp 03/05/15 1102 20     Temp 03/05/15 1102 97.6 F (36.4 C)     Temp Source 03/05/15 1102 Oral     SpO2 03/05/15 1102 100 %     Weight 03/05/15 1102 200 lb (90.719 kg)     Height 03/05/15 1102 5\' 5"  (1.651 m)     Head Cir --      Peak Flow --  Pain Score 03/05/15 1103 8     Pain Loc --      Pain Edu? --      Excl. in [place]? --      Constitutional: Alert and oriented. Well appearing and in no distress. Eyes: Conjunctivae are normal.  ENT   Head: Normocephalic and atraumatic.   Mouth/Throat: Mucous membranes are moist. Cardiovascular: Normal rate, regular rhythm.  Respiratory: Normal respiratory effort without tachypnea nor retractions.  Gastrointestinal: Soft and non-tender in all quadrants. No distention. There is no CVA tenderness. Musculoskeletal: Nontender with normal range of motion in all extremities. Compartments are soft in the left upper extremity, 2+ distal pulses, normal cap refill. No ecchymosis or erythema noted. Full range of motion and normal strength. Neurologic:  Normal speech and language. No gross focal neurologic deficits are appreciated. Strength is normal in the upper  extremities. Sensation is grossly intact Skin:  Skin is warm, dry and intact. No rash noted. Psychiatric: Mood and affect are normal. Patient exhibits appropriate insight and judgment.  ____________________________________________    LABS (pertinent positives/negatives)  Labs Reviewed  RAPID INFLUENZA A&B ANTIGENS ([place])    ____________________________________________     ____________________________________________    RADIOLOGY I have personally reviewed any xrays that were ordered on this patient: Ultrasound of the upper extremity normal  ____________________________________________   PROCEDURES  Procedure(s) performed: none   ____________________________________________   INITIAL IMPRESSION / ASSESSMENT AND PLAN / ED COURSE  Pertinent labs & imaging results that were available during my care of the patient were reviewed by me and considered in my medical decision making (see chart for details).  Patient presents with left upper extremity "aching". No evidence of vascular or neurological deficits. Arms are soft. Given her gravid state ultrasound ordered which was unremarkable.  Ultrasound normal. Recommend supportive care and follow-up with her OB/GYN  ____________________________________________   FINAL CLINICAL IMPRESSION(S) / ED DIAGNOSES  Final diagnoses:  Pain of left upper extremity     Lavonia Drafts, MD 03/05/15 1511

## 2015-03-05 NOTE — ED Notes (Signed)
Patient off the floor in ultrasound

## 2015-03-05 NOTE — Discharge Instructions (Signed)

## 2015-03-05 NOTE — ED Notes (Signed)
Left shouldr pain , radiating to left hand , no injury , recalls picking a baby swing , this AM cramping lateral neck pain this AM.

## 2015-03-13 ENCOUNTER — Encounter: Payer: Self-pay | Admitting: Obstetrics and Gynecology

## 2015-03-22 ENCOUNTER — Encounter: Payer: Self-pay | Admitting: Obstetrics and Gynecology

## 2015-03-22 ENCOUNTER — Ambulatory Visit (INDEPENDENT_AMBULATORY_CARE_PROVIDER_SITE_OTHER): Payer: Medicaid Other | Admitting: Obstetrics and Gynecology

## 2015-03-22 VITALS — BP 109/69 | HR 97 | Wt 207.8 lb

## 2015-03-22 DIAGNOSIS — Z331 Pregnant state, incidental: Secondary | ICD-10-CM

## 2015-03-22 LAB — POCT URINALYSIS DIPSTICK
Bilirubin, UA: NEGATIVE
Blood, UA: NEGATIVE
Glucose, UA: NEGATIVE
Ketones, UA: NEGATIVE
Leukocytes, UA: NEGATIVE
Nitrite, UA: NEGATIVE
Protein, UA: NEGATIVE
Spec Grav, UA: 1.01
Urobilinogen, UA: 0.2
pH, UA: 6

## 2015-03-22 NOTE — Progress Notes (Signed)
ROB-pt denies any complaints 

## 2015-03-22 NOTE — Progress Notes (Signed)
ROB- doing well, less constipated now; anatomy scan next visit

## 2015-04-03 ENCOUNTER — Encounter (HOSPITAL_COMMUNITY): Payer: Self-pay | Admitting: *Deleted

## 2015-04-03 ENCOUNTER — Emergency Department
Admission: EM | Admit: 2015-04-03 | Discharge: 2015-04-03 | Discharge status: Absent without leave | Payer: Medicaid Other

## 2015-04-03 ENCOUNTER — Inpatient Hospital Stay (HOSPITAL_COMMUNITY)
Admission: AD | Admit: 2015-04-03 | Discharge: 2015-04-03 | Discharge status: Against Medical Advice | Payer: Medicaid Other | Source: Ambulatory Visit | Attending: Family Medicine | Admitting: Family Medicine

## 2015-04-03 DIAGNOSIS — K5909 Other constipation: Secondary | ICD-10-CM | POA: Diagnosis not present

## 2015-04-03 DIAGNOSIS — O09899 Supervision of other high risk pregnancies, unspecified trimester: Secondary | ICD-10-CM

## 2015-04-03 DIAGNOSIS — R109 Unspecified abdominal pain: Secondary | ICD-10-CM | POA: Diagnosis not present

## 2015-04-03 DIAGNOSIS — O9989 Other specified diseases and conditions complicating pregnancy, childbirth and the puerperium: Secondary | ICD-10-CM | POA: Diagnosis not present

## 2015-04-03 DIAGNOSIS — O26899 Other specified pregnancy related conditions, unspecified trimester: Secondary | ICD-10-CM

## 2015-04-03 DIAGNOSIS — Z283 Underimmunization status: Secondary | ICD-10-CM

## 2015-04-03 LAB — URINALYSIS, ROUTINE W REFLEX MICROSCOPIC
Bilirubin Urine: NEGATIVE
Glucose, UA: NEGATIVE mg/dL
Ketones, ur: NEGATIVE mg/dL
Nitrite: NEGATIVE
Protein, ur: NEGATIVE mg/dL
Specific Gravity, Urine: 1.015 (ref 1.005–1.030)
pH: 6 (ref 5.0–8.0)

## 2015-04-03 LAB — URINE MICROSCOPIC-ADD ON

## 2015-04-03 LAB — WET PREP, GENITAL
Sperm: NONE SEEN
Trich, Wet Prep: NONE SEEN
Yeast Wet Prep HPF POC: NONE SEEN

## 2015-04-03 NOTE — MAU Note (Signed)
RN came out of another room and this patient had left MAU without letting anyone know.

## 2015-04-03 NOTE — MAU Note (Signed)
Pt reports cramping as painfully as when she miscarried in October. Pt gets her care in [place], but was in [place], so she came to [place] reports that she had some bleeding before coming into the hospital.

## 2015-04-03 NOTE — MAU Provider Note (Signed)
History   [id number] in with c/o vag bleeding and cramping that started today. Gets care in [place] but came here for eval.   CSN: [id number]  Arrival date & time 04/03/15  [id number]   First Provider Initiated Contact with Patient 04/03/15 1924      Chief Complaint  Patient presents with  . Abdominal Pain  . Vaginal Bleeding    HPI  Past Medical History  Diagnosis Date  . Herpes genitalia     last outbreak 1-2 yrs ago    Past Surgical History  Procedure Laterality Date  . Eye surgery    . Eye surgery Bilateral     Family History  Problem Relation Age of Onset  . Anesthesia problems Neg Hx   . Diabetes Mother     Social History  Substance Use Topics  . Smoking status: Never Smoker   . Smokeless tobacco: Never Used  . Alcohol Use: Not on file    OB History    Gravida Para Term Preterm AB TAB SAB Ectopic Multiple Living   5 3 3  0 1 0 1 0 0 3      Obstetric Comments   09/12/2011 pt leaking fluid at 32wks, hospitalized until delivery at 38wks.      Review of Systems  Constitutional: Negative.   HENT: Negative.   Eyes: Negative.   Respiratory: Negative.   Cardiovascular: Negative.   Gastrointestinal: Positive for abdominal pain.  Endocrine: Negative.   Genitourinary: Positive for vaginal bleeding.  Musculoskeletal: Negative.   Skin: Negative.   Allergic/Immunologic: Negative.   Neurological: Negative.   Hematological: Negative.   Psychiatric/Behavioral: Negative.     Allergies  Review of patient's allergies indicates no known allergies.  Home Medications  No current outpatient prescriptions on file.  BP 126/78 mmHg  Pulse 86  Temp(Src) 98.7 F (37.1 C) (Oral)  Resp 20  Ht 5' 5.5" (1.664 m)  Wt 209 lb (94.802 kg)  BMI 34.24 kg/m2  LMP 12/01/2014  Physical Exam  Constitutional: She is oriented to person, place, and time. She appears well-developed and well-nourished.  HENT:  Head: Normocephalic.  Eyes: Pupils are equal, round, and reactive to  light.  Neck: Normal range of motion. Neck supple.  Cardiovascular: Normal rate, regular rhythm, normal heart sounds and intact distal pulses.   Pulmonary/Chest: Effort normal and breath sounds normal.  Abdominal: Soft. Bowel sounds are normal.  Genitourinary: Vagina normal and uterus normal.  Musculoskeletal: Normal range of motion.  Neurological: She is alert and oriented to person, place, and time. She has normal reflexes.  Skin: Skin is warm and dry.  Psychiatric: She has a normal mood and affect. Her behavior is normal. Judgment and thought content normal.    MAU Course  Procedures (including critical care time)  Labs Reviewed  WET PREP, GENITAL  URINALYSIS, ROUTINE W REFLEX MICROSCOPIC (NOT AT [facility])  GC/CHLAMYDIA PROBE AMP ([place]) NOT AT [facility]   No results found.   1. Rubella non-immune status, antepartum   2. Chronic constipation       MDM  Sterile spec exam done cultures obtained, wet prep, spotty amt of brownish bleeding noted in os. No active bleeding. FHR 160 st and reg with doppler. SVE cervix firm/cl/post/high. Will d/c home with normal lab results. Pt left AMA.

## 2015-04-03 NOTE — MAU Note (Signed)
Patient presents at 17 weeks with c/o abdominal cramping X 2 weeks and bleeding today. States she has a subchorionic hemorrhage.

## 2015-04-04 ENCOUNTER — Telehealth: Payer: Self-pay | Admitting: Obstetrics and Gynecology

## 2015-04-04 LAB — GC/CHLAMYDIA PROBE AMP (~~LOC~~) NOT AT ARMC
Chlamydia: NEGATIVE
Neisseria Gonorrhea: NEGATIVE

## 2015-04-04 NOTE — Telephone Encounter (Signed)
PT CALLED AND SHE WENT TO [facility] LAST NIGHT SHE WAS HAVING CRAMPING AND SOME BLEEDING AND THEY TOLD HER THAT SHE HAD A UTI AND POSSIBLE THREATEN MISCARRIAGE BUT THE HEART BEAT WAS FINE, THEY TOLD HER TO CALL HERE AND LET YOU AND MNS KNOW. NOT SURE IF WE NEED TO SEE HER FOR AN APPT OR NOT, THEY ONLY TOLD HER TO CALL.

## 2015-04-04 NOTE — Telephone Encounter (Signed)
Pt went to [facility] ER for spotting, states she hasnt seen anymore spotting, advised pt to let me know we would get her in to see Melody

## 2015-04-19 ENCOUNTER — Encounter: Payer: Self-pay | Admitting: Obstetrics and Gynecology

## 2015-04-19 ENCOUNTER — Ambulatory Visit (INDEPENDENT_AMBULATORY_CARE_PROVIDER_SITE_OTHER): Payer: Medicaid Other

## 2015-04-19 ENCOUNTER — Ambulatory Visit (INDEPENDENT_AMBULATORY_CARE_PROVIDER_SITE_OTHER): Payer: Medicaid Other | Admitting: Obstetrics and Gynecology

## 2015-04-19 VITALS — BP 116/83 | HR 93 | Wt 211.7 lb

## 2015-04-19 DIAGNOSIS — Z331 Pregnant state, incidental: Secondary | ICD-10-CM | POA: Diagnosis not present

## 2015-04-19 LAB — POCT URINALYSIS DIPSTICK
Glucose, UA: NEGATIVE
Ketones, UA: NEGATIVE
Leukocytes, UA: NEGATIVE
Nitrite, UA: NEGATIVE
Protein, UA: NEGATIVE
Spec Grav, UA: 1.01
Urobilinogen, UA: 0.2
pH, UA: 6.5

## 2015-04-19 MED ORDER — POLYETHYLENE GLYCOL 3350 17 GM/SCOOP PO POWD: 1.0000 | Freq: Once | ORAL | Status: DC

## 2015-04-19 NOTE — Progress Notes (Signed)
Indications:  Anatomy Findings:  Singleton intrauterine pregnancy is visualized with FHR at 135 BPM. Biometrics give an (U/S) Gestational age of [redacted] weeks 1 day, and an (U/S) EDD of 09/05/15; this correlates with the clinically established EDD of 09/07/15.  Fetal presentation is Variable (vertex at start, breech at end).  EFW: 356 grams ( 0 lbs. 13 oz. ). Placenta: Posterior, grade 1 and remote to cervix by 5.7 cm. AFI: adequate with a MVP of 4.8 cm.  Anatomic survey is complete and appears WNL. Gender - Female.   Right Ovary measures 3.1 x 2.0 x 2.2  Cm, and appears WNL. Left Ovary measures 3.2 x 2.1 x 2.6 cm, and appears WNL. There is no evidence of a corpus luteal cyst. Survey of the adnexa demonstrates no adnexal masses. There is no free peritoneal fluid in the cul de sac.  Impression: 1. 20 week 1 day Viable Singleton Intrauterine pregnancy by U/S. 2. (U/S) EDD is consistent with Clinically established (LMP) EDD of 09/07/15. 3. Normal appearing anatomy scan.

## 2015-04-19 NOTE — Progress Notes (Signed)
ROB- pt is doing well 

## 2015-05-03 ENCOUNTER — Encounter: Payer: Self-pay | Admitting: *Deleted

## 2015-05-03 ENCOUNTER — Inpatient Hospital Stay
Admission: EM | Admit: 2015-05-03 | Discharge: 2015-05-03 | Disposition: A | Discharge status: Home / Self Care | Payer: Medicaid Other | Attending: Obstetrics and Gynecology | Admitting: Obstetrics and Gynecology

## 2015-05-03 DIAGNOSIS — Z3A23 23 weeks gestation of pregnancy: Secondary | ICD-10-CM | POA: Insufficient documentation

## 2015-05-03 DIAGNOSIS — O9989 Other specified diseases and conditions complicating pregnancy, childbirth and the puerperium: Secondary | ICD-10-CM

## 2015-05-03 DIAGNOSIS — K5909 Other constipation: Secondary | ICD-10-CM

## 2015-05-03 DIAGNOSIS — Z283 Underimmunization status: Secondary | ICD-10-CM

## 2015-05-03 DIAGNOSIS — K5900 Constipation, unspecified: Secondary | ICD-10-CM | POA: Diagnosis present

## 2015-05-03 DIAGNOSIS — O26892 Other specified pregnancy related conditions, second trimester: Secondary | ICD-10-CM | POA: Insufficient documentation

## 2015-05-03 DIAGNOSIS — Z2839 Other underimmunization status: Secondary | ICD-10-CM

## 2015-05-03 LAB — URINALYSIS COMPLETE WITH MICROSCOPIC (ARMC ONLY)
Bilirubin Urine: NEGATIVE
Glucose, UA: NEGATIVE mg/dL
Ketones, ur: NEGATIVE mg/dL
Leukocytes, UA: NEGATIVE
Nitrite: NEGATIVE
Protein, ur: NEGATIVE mg/dL
Specific Gravity, Urine: 1.024 (ref 1.005–1.030)
pH: 5 (ref 5.0–8.0)

## 2015-05-03 NOTE — OB Triage Note (Signed)
Pt discharged, told to follow up with Felisa Bonier if symptoms get worse. Encouraged to limit sweet tea and soda and drink plenty of water.

## 2015-05-04 ENCOUNTER — Telehealth: Payer: Self-pay | Admitting: Obstetrics and Gynecology

## 2015-05-04 NOTE — Telephone Encounter (Signed)
Patient was seen in the ER with Kidney stones and she is having lower back pain tightening in her stomach as well as sharp pains in her lower stomach. Please Advise

## 2015-05-04 NOTE — Telephone Encounter (Signed)
She was not seen for kidney stones, nurse mentioned to her it may be stones, but we were waiting on urine culture and the preliminary is negative. She wasn't contracting yesterday on the monitor, but urine real concentrated. So have her drink more water, warm bath, rest. If symptoms are worse than they were last night she will need to be seen. If not, to do as instructed above, and keep next appt.

## 2015-05-05 ENCOUNTER — Telehealth: Payer: Self-pay | Admitting: Obstetrics and Gynecology

## 2015-05-05 LAB — URINE CULTURE

## 2015-05-05 NOTE — Telephone Encounter (Signed)
PT RETURNED YOUR CALL

## 2015-05-05 NOTE — Telephone Encounter (Signed)
-----   Message from Joylene Igo, [place] sent at 05/04/2015 10:18 AM EDT ----- Please let her know- she was in L&D last night, also see how she is feeling

## 2015-05-05 NOTE — Telephone Encounter (Signed)
Spoke with pt she voiced understanding

## 2015-05-08 NOTE — Telephone Encounter (Signed)
Spoke with pt she voiced understanding

## 2015-05-18 ENCOUNTER — Other Ambulatory Visit: Payer: Self-pay | Admitting: Obstetrics and Gynecology

## 2015-05-18 ENCOUNTER — Ambulatory Visit (INDEPENDENT_AMBULATORY_CARE_PROVIDER_SITE_OTHER): Payer: Medicaid Other | Admitting: Obstetrics and Gynecology

## 2015-05-18 ENCOUNTER — Encounter: Payer: Self-pay | Admitting: Obstetrics and Gynecology

## 2015-05-18 VITALS — BP 117/74 | HR 86 | Wt 218.6 lb

## 2015-05-18 DIAGNOSIS — Z331 Pregnant state, incidental: Secondary | ICD-10-CM

## 2015-05-18 DIAGNOSIS — K649 Unspecified hemorrhoids: Secondary | ICD-10-CM | POA: Insufficient documentation

## 2015-05-18 LAB — POCT URINALYSIS DIPSTICK
Bilirubin, UA: NEGATIVE
Blood, UA: NEGATIVE
Glucose, UA: NEGATIVE
Ketones, UA: NEGATIVE
Nitrite, UA: NEGATIVE
Spec Grav, UA: <=1.005
Urobilinogen, UA: 0.2
pH, UA: 7

## 2015-05-18 MED ORDER — HYDROCORTISONE 2.5 % RE CREA: 1.0000 "application " | TOPICAL_CREAM | Freq: Two times a day (BID) | RECTAL | Status: DC

## 2015-05-18 NOTE — Progress Notes (Signed)
ROB- pt c/o fatigue, feeling irriated,blurry vision

## 2015-05-18 NOTE — Progress Notes (Signed)
ROB-reassured of normal symptoms of pregnancy, glucola next visit

## 2015-06-12 ENCOUNTER — Encounter: Payer: Self-pay | Admitting: Obstetrics and Gynecology

## 2015-06-12 ENCOUNTER — Telehealth: Payer: Self-pay | Admitting: Obstetrics and Gynecology

## 2015-06-12 NOTE — Telephone Encounter (Signed)
Pt states this started 4 days ago. Able to wear flip flops. No headaches, blurred vision (same as last visit), no dizziness, and no spots before eyes. Pt to keep lower extremeties above heart when at rest, increase water intake and to contact office if any symptoms develop. Has an appt on Friday.

## 2015-06-12 NOTE — Telephone Encounter (Signed)
PT IS ALMOST [redacted] WK PREGNANT, ANKLES SWOLLEN AND HURTS WHEN SHE WALKS. MELODY'S PT

## 2015-06-16 ENCOUNTER — Other Ambulatory Visit: Payer: Medicaid Other

## 2015-06-16 ENCOUNTER — Encounter: Payer: Self-pay | Admitting: Obstetrics and Gynecology

## 2015-06-16 ENCOUNTER — Other Ambulatory Visit: Payer: Self-pay | Admitting: Obstetrics and Gynecology

## 2015-06-16 ENCOUNTER — Ambulatory Visit (INDEPENDENT_AMBULATORY_CARE_PROVIDER_SITE_OTHER): Payer: Medicaid Other | Admitting: Obstetrics and Gynecology

## 2015-06-16 VITALS — BP 118/70 | HR 93 | Wt 228.0 lb

## 2015-06-16 DIAGNOSIS — Z23 Encounter for immunization: Secondary | ICD-10-CM

## 2015-06-16 DIAGNOSIS — Z3493 Encounter for supervision of normal pregnancy, unspecified, third trimester: Secondary | ICD-10-CM | POA: Diagnosis not present

## 2015-06-16 LAB — POCT URINALYSIS DIPSTICK
Bilirubin, UA: NEGATIVE
Blood, UA: NEGATIVE
Glucose, UA: NEGATIVE
Ketones, UA: NEGATIVE
Leukocytes, UA: NEGATIVE
Nitrite, UA: NEGATIVE
Protein, UA: NEGATIVE
Spec Grav, UA: 1.015
Urobilinogen, UA: 0.2
pH, UA: 7.5

## 2015-06-16 MED ORDER — TETANUS-DIPHTH-ACELL PERTUSSIS 5-2.5-18.5 LF-MCG/0.5 IM SUSP
0.5000 mL | Freq: Once | INTRAMUSCULAR | Status: AC
  Administered 2015-06-16: 0.5 mL via INTRAMUSCULAR

## 2015-06-16 NOTE — Progress Notes (Signed)
ROB- GTT,BTC signed, and tdap done.

## 2015-06-16 NOTE — Progress Notes (Signed)
ROB- glucola done; pedal edema coming and going; discussed h/o of PROM(slow leak) at 32 weeks, with delivery at 36 weeks, will do scan at 32 weeks to check fluid levels.

## 2015-06-16 NOTE — Patient Instructions (Signed)
Thank you for enrolling in [facility]. Please follow the instructions below to securely access your online medical record. MyChart allows you to send messages to your doctor, view your test results, renew your prescriptions, schedule appointments, and more.  How Do I Sign Up? 1. In your Internet browser, go to [email or web address]. 2. Click on the New  User? link in the Sign In box.  3. Enter your MyChart Access Code exactly as it appears below. You will not need to use this code after you have completed the sign-up process. If you do not sign up before the expiration date, you must request a new code. MyChart Access Code: [email or web address] Expires: 07/02/2015  9:58 PM  4. Enter the last four digits of your Social Security Number (xxxx) and Date of Birth (mm/dd/yyyy) as indicated and click Next. You will be taken to the next sign-up page. 5. Create a MyChart ID. This will be your MyChart login ID and cannot be changed, so think of one that is secure and easy to remember. 6. Create a MyChart password. You can change your password at any time. 7. Enter your Password Reset Question and Answer and click Next. This can be used at a later time if you forget your password.  8. Select your communication preference, and if applicable enter your e-mail address. You will receive e-mail notification when new information is available in MyChart by choosing to receive e-mail notifications and filling in your e-mail. 9. Click Sign In. You can now view your medical record.   Additional Information If you have questions, you can email [email or web address] or call [phone] to talk to our [facility] staff. Remember, MyChart is NOT to be used for urgent needs. For medical emergencies, dial 911. Third Trimester of Pregnancy The third trimester is from week 29 through week 42, months 7 through 9. The third trimester is a time when the fetus is growing rapidly. At the end of the ninth month,  the fetus is about 20 inches in length and weighs 6-10 pounds.  BODY CHANGES Your body goes through many changes during pregnancy. The changes vary from woman to woman.   Your weight will continue to increase. You can expect to gain 25-35 pounds (11-16 kg) by the end of the pregnancy.  You may begin to get stretch marks on your hips, abdomen, and breasts.  You may urinate more often because the fetus is moving lower into your pelvis and pressing on your bladder.  You may develop or continue to have heartburn as a result of your pregnancy.  You may develop constipation because certain hormones are causing the muscles that push waste through your intestines to slow down.  You may develop hemorrhoids or swollen, bulging veins (varicose veins).  You may have pelvic pain because of the weight gain and pregnancy hormones relaxing your joints between the bones in your pelvis. Backaches may result from overexertion of the muscles supporting your posture.  You may have changes in your hair. These can include thickening of your hair, rapid growth, and changes in texture. Some women also have hair loss during or after pregnancy, or hair that feels dry or thin. Your hair will most likely return to normal after your baby is born.  Your breasts will continue to grow and be tender. A yellow discharge may leak from your breasts called colostrum.  Your belly button may stick out.  You may feel short of breath because of your expanding uterus.  You  may notice the fetus "dropping," or moving lower in your abdomen.  You may have a bloody mucus discharge. This usually occurs a few days to a week before labor begins.  Your cervix becomes thin and soft (effaced) near your due date. WHAT TO EXPECT AT YOUR PRENATAL EXAMS  You will have prenatal exams every 2 weeks until week 36. Then, you will have weekly prenatal exams. During a routine prenatal visit:  You will be weighed to make sure you and the fetus  are growing normally.  Your blood pressure is taken.  Your abdomen will be measured to track your baby's growth.  The fetal heartbeat will be listened to.  Any test results from the previous visit will be discussed.  You may have a cervical check near your due date to see if you have effaced. At around 36 weeks, your caregiver will check your cervix. At the same time, your caregiver will also perform a test on the secretions of the vaginal tissue. This test is to determine if a type of bacteria, Group B streptococcus, is present. Your caregiver will explain this further. Your caregiver may ask you:  What your birth plan is.  How you are feeling.  If you are feeling the baby move.  If you have had any abnormal symptoms, such as leaking fluid, bleeding, severe headaches, or abdominal cramping.  If you are using any tobacco products, including cigarettes, chewing tobacco, and electronic cigarettes.  If you have any questions. Other tests or screenings that may be performed during your third trimester include:  Blood tests that check for low iron levels (anemia).  Fetal testing to check the health, activity level, and growth of the fetus. Testing is done if you have certain medical conditions or if there are problems during the pregnancy.  HIV (human immunodeficiency virus) testing. If you are at high risk, you may be screened for HIV during your third trimester of pregnancy. FALSE LABOR You may feel small, irregular contractions that eventually go away. These are called Braxton Hicks contractions, or false labor. Contractions may last for hours, days, or even weeks before true labor sets in. If contractions come at regular intervals, intensify, or become painful, it is best to be seen by your caregiver.  SIGNS OF LABOR   Menstrual-like cramps.  Contractions that are 5 minutes apart or less.  Contractions that start on the top of the uterus and spread down to the lower abdomen and  back.  A sense of increased pelvic pressure or back pain.  A watery or bloody mucus discharge that comes from the vagina. If you have any of these signs before the 37th week of pregnancy, call your caregiver right away. You need to go to the hospital to get checked immediately. HOME CARE INSTRUCTIONS   Avoid all smoking, herbs, alcohol, and unprescribed drugs. These chemicals affect the formation and growth of the baby.  Do not use any tobacco products, including cigarettes, chewing tobacco, and electronic cigarettes. If you need help quitting, ask your health care provider. You may receive counseling support and other resources to help you quit.  Follow your caregiver's instructions regarding medicine use. There are medicines that are either safe or unsafe to take during pregnancy.  Exercise only as directed by your caregiver. Experiencing uterine cramps is a good sign to stop exercising.  Continue to eat regular, healthy meals.  Wear a good support bra for breast tenderness.  Do not use hot tubs, steam rooms, or saunas.  Wear your seat belt at all times when driving.  Avoid raw meat, uncooked cheese, cat litter boxes, and soil used by cats. These carry germs that can cause birth defects in the baby.  Take your prenatal vitamins.  Take 1500-2000 mg of calcium daily starting at the 20th week of pregnancy until you deliver your baby.  Try taking a stool softener (if your caregiver approves) if you develop constipation. Eat more high-fiber foods, such as fresh vegetables or fruit and whole grains. Drink plenty of fluids to keep your urine clear or pale yellow.  Take warm sitz baths to soothe any pain or discomfort caused by hemorrhoids. Use hemorrhoid cream if your caregiver approves.  If you develop varicose veins, wear support hose. Elevate your feet for 15 minutes, 3-4 times a day. Limit salt in your diet.  Avoid heavy lifting, wear low heal shoes, and practice good  posture.  Rest a lot with your legs elevated if you have leg cramps or low back pain.  Visit your dentist if you have not gone during your pregnancy. Use a soft toothbrush to brush your teeth and be gentle when you floss.  A sexual relationship may be continued unless your caregiver directs you otherwise.  Do not travel far distances unless it is absolutely necessary and only with the approval of your caregiver.  Take prenatal classes to understand, practice, and ask questions about the labor and delivery.  Make a trial run to the hospital.  Pack your hospital bag.  Prepare the baby's nursery.  Continue to go to all your prenatal visits as directed by your caregiver. SEEK MEDICAL CARE IF:  You are unsure if you are in labor or if your water has broken.  You have dizziness.  You have mild pelvic cramps, pelvic pressure, or nagging pain in your abdominal area.  You have persistent nausea, vomiting, or diarrhea.  You have a bad smelling vaginal discharge.  You have pain with urination. SEEK IMMEDIATE MEDICAL CARE IF:   You have a fever.  You are leaking fluid from your vagina.  You have spotting or bleeding from your vagina.  You have severe abdominal cramping or pain.  You have rapid weight loss or gain.  You have shortness of breath with chest pain.  You notice sudden or extreme swelling of your face, hands, ankles, feet, or legs.  You have not felt your baby move in over an hour.  You have severe headaches that do not go away with medicine.  You have vision changes.   This information is not intended to replace advice given to you by your health care provider. Make sure you discuss any questions you have with your health care provider.   Document Released: 12/18/2000 Document Revised: 01/14/2014 Document Reviewed: 02/25/2012 Elsevier Interactive Patient Education [employer].

## 2015-06-17 LAB — HEMOGLOBIN: Hemoglobin: 10.4 g/dL — ABNORMAL LOW (ref 11.1–15.9)

## 2015-06-17 LAB — HEMATOCRIT: Hematocrit: 31.3 % — ABNORMAL LOW (ref 34.0–46.6)

## 2015-06-17 LAB — GLUCOSE, 1 HOUR GESTATIONAL: Gestational Diabetes Screen: 106 mg/dL (ref 65–139)

## 2015-06-21 ENCOUNTER — Other Ambulatory Visit: Payer: Self-pay | Admitting: Obstetrics and Gynecology

## 2015-06-21 ENCOUNTER — Telehealth: Payer: Self-pay

## 2015-06-21 DIAGNOSIS — D649 Anemia, unspecified: Secondary | ICD-10-CM | POA: Insufficient documentation

## 2015-06-21 MED ORDER — FUSION PLUS PO CAPS: 1.0000 | ORAL_CAPSULE | Freq: Every day | ORAL | Status: DC

## 2015-06-21 NOTE — Telephone Encounter (Signed)
Pt aware of lab results. Pt states iron is not covered by medicaid. Pt aware she will have to be self pay.

## 2015-07-05 ENCOUNTER — Other Ambulatory Visit: Payer: Self-pay | Admitting: Obstetrics and Gynecology

## 2015-07-05 ENCOUNTER — Inpatient Hospital Stay
Admission: EM | Admit: 2015-07-05 | Discharge: 2015-07-06 | Disposition: A | Discharge status: Home / Self Care | Payer: Medicaid Other | Attending: Obstetrics and Gynecology | Admitting: Obstetrics and Gynecology

## 2015-07-05 DIAGNOSIS — Z3A30 30 weeks gestation of pregnancy: Secondary | ICD-10-CM | POA: Insufficient documentation

## 2015-07-05 DIAGNOSIS — O26893 Other specified pregnancy related conditions, third trimester: Secondary | ICD-10-CM | POA: Diagnosis not present

## 2015-07-05 DIAGNOSIS — A6000 Herpesviral infection of urogenital system, unspecified: Secondary | ICD-10-CM | POA: Insufficient documentation

## 2015-07-05 DIAGNOSIS — N898 Other specified noninflammatory disorders of vagina: Secondary | ICD-10-CM | POA: Insufficient documentation

## 2015-07-05 LAB — URINALYSIS COMPLETE WITH MICROSCOPIC (ARMC ONLY)
Bilirubin Urine: NEGATIVE
Glucose, UA: NEGATIVE mg/dL
Ketones, ur: NEGATIVE mg/dL
Nitrite: NEGATIVE
Protein, ur: NEGATIVE mg/dL
Specific Gravity, Urine: 1.014 (ref 1.005–1.030)
pH: 7 (ref 5.0–8.0)

## 2015-07-05 LAB — FETAL FIBRONECTIN: Fetal Fibronectin: NEGATIVE

## 2015-07-05 MED ORDER — NITROFURANTOIN MONOHYD MACRO 100 MG PO CAPS
100.0000 mg | ORAL_CAPSULE | Freq: Two times a day (BID) | ORAL | Status: DC
  Administered 2015-07-05: 100 mg via ORAL

## 2015-07-05 MED ORDER — ACYCLOVIR 400 MG PO TABS: 400.0000 mg | ORAL_TABLET | Freq: Two times a day (BID) | ORAL | Status: DC

## 2015-07-05 NOTE — OB Triage Provider Note (Signed)
L&D OB Triage Note  Krystal Harrison is a 30 y.o. [id number] female at [redacted]w[redacted]d, EDD Estimated Date of Delivery: 09/07/15 who presented to triage for complaints of small gush of clear fluid at 1400 with back pain and irregular [place]. Does have h/o PPROM at 32 weeks with delivery at 36 weeks with last pregnancy. Denies recent sex, vaginal discharge or spotting.   She was evaluated by the myself with no significant findings/findings significant for ROM or labor. [place] and [place] test was negative. streile speculm exam also negative, cervix FT/thick/OOP and perineum was dry. Vital signs stable. An NST was performed and has been reviewed. Urine sent for analysis and culture.  NST INTERPRETATION: Indications: rule out uterine contractions                   Impression: reactive   Plan: NST performed was reviewed and was found to be reactive. She was discharged home with bleeding/labor precautions.  Will start antibiotics if indicated. Continue routine prenatal care. Follow up with OB/GYN as previously scheduled.  Also will go ahead and start HSV prophylaxis due to previous PTD. Order sent to pharmacy.    Darol Cush Rockney Ghee, CNM

## 2015-07-06 ENCOUNTER — Other Ambulatory Visit: Payer: Self-pay | Admitting: Obstetrics and Gynecology

## 2015-07-06 DIAGNOSIS — O26893 Other specified pregnancy related conditions, third trimester: Secondary | ICD-10-CM | POA: Diagnosis not present

## 2015-07-06 LAB — ROM PLUS (ARMC ONLY): Rom Plus: NEGATIVE

## 2015-07-06 MED ORDER — NITROFURANTOIN MONOHYD MACRO 100 MG PO CAPS
ORAL_CAPSULE | ORAL | Status: AC
  Administered 2015-07-05: 100 mg via ORAL
  Filled 2015-07-06: qty 1

## 2015-07-06 MED ORDER — NITROFURANTOIN MONOHYD MACRO 100 MG PO CAPS: 100.0000 mg | ORAL_CAPSULE | Freq: Two times a day (BID) | ORAL | Status: DC

## 2015-07-07 ENCOUNTER — Ambulatory Visit (INDEPENDENT_AMBULATORY_CARE_PROVIDER_SITE_OTHER): Payer: Medicaid Other | Admitting: Obstetrics and Gynecology

## 2015-07-07 ENCOUNTER — Encounter: Payer: Self-pay | Admitting: Obstetrics and Gynecology

## 2015-07-07 VITALS — BP 120/74 | HR 104 | Wt 231.2 lb

## 2015-07-07 DIAGNOSIS — Z3493 Encounter for supervision of normal pregnancy, unspecified, third trimester: Secondary | ICD-10-CM

## 2015-07-07 LAB — POCT URINALYSIS DIPSTICK
Bilirubin, UA: NEGATIVE
Blood, UA: NEGATIVE
Glucose, UA: NEGATIVE
Ketones, UA: NEGATIVE
Leukocytes, UA: NEGATIVE
Nitrite, UA: NEGATIVE
Spec Grav, UA: 1.02
Urobilinogen, UA: 0.2
pH, UA: 6

## 2015-07-07 LAB — URINE CULTURE
Culture: NO GROWTH
Special Requests: NORMAL

## 2015-07-07 NOTE — Progress Notes (Signed)
ROB- feeling beterr since L&D visit this week, is taking antibiotics.

## 2015-07-07 NOTE — Progress Notes (Signed)
ROB- pt is having a lot of pressure, having some sharp pains in lower abd

## 2015-07-14 ENCOUNTER — Ambulatory Visit (INDEPENDENT_AMBULATORY_CARE_PROVIDER_SITE_OTHER): Payer: Medicaid Other

## 2015-07-14 DIAGNOSIS — Z3493 Encounter for supervision of normal pregnancy, unspecified, third trimester: Secondary | ICD-10-CM | POA: Diagnosis not present

## 2015-07-19 ENCOUNTER — Encounter: Payer: Medicaid Other | Admitting: Obstetrics and Gynecology

## 2015-07-19 ENCOUNTER — Encounter: Payer: Self-pay | Admitting: Obstetrics and Gynecology

## 2015-07-19 ENCOUNTER — Ambulatory Visit (INDEPENDENT_AMBULATORY_CARE_PROVIDER_SITE_OTHER): Payer: Medicaid Other | Admitting: Obstetrics and Gynecology

## 2015-07-19 VITALS — BP 111/74 | HR 99 | Wt 234.6 lb

## 2015-07-19 DIAGNOSIS — O09299 Supervision of pregnancy with other poor reproductive or obstetric history, unspecified trimester: Secondary | ICD-10-CM | POA: Insufficient documentation

## 2015-07-19 DIAGNOSIS — Z3493 Encounter for supervision of normal pregnancy, unspecified, third trimester: Secondary | ICD-10-CM

## 2015-07-19 DIAGNOSIS — R638 Other symptoms and signs concerning food and fluid intake: Secondary | ICD-10-CM | POA: Insufficient documentation

## 2015-07-19 DIAGNOSIS — O09293 Supervision of pregnancy with other poor reproductive or obstetric history, third trimester: Secondary | ICD-10-CM

## 2015-07-19 LAB — POCT URINALYSIS DIPSTICK
Glucose, UA: NEGATIVE
Ketones, UA: NEGATIVE
Nitrite, UA: NEGATIVE
Spec Grav, UA: 1.025
Urobilinogen, UA: 0.2
pH, UA: 6

## 2015-07-19 NOTE — Progress Notes (Signed)
Rob- no complaints. Size slightly greater than dates. Watch for possible developing fetal macrosomia.

## 2015-07-21 ENCOUNTER — Telehealth: Payer: Self-pay | Admitting: Obstetrics and Gynecology

## 2015-07-21 NOTE — Telephone Encounter (Signed)
Pt is an ob patient an dstated that she would like a call back from a nurse

## 2015-07-21 NOTE — Telephone Encounter (Signed)
Called pt no answer. LM for pt to call back.  

## 2015-07-23 ENCOUNTER — Observation Stay
Admission: EM | Admit: 2015-07-23 | Discharge: 2015-07-23 | Disposition: A | Discharge status: Home / Self Care | Payer: Medicaid Other | Attending: Obstetrics and Gynecology | Admitting: Obstetrics and Gynecology

## 2015-07-23 DIAGNOSIS — O9989 Other specified diseases and conditions complicating pregnancy, childbirth and the puerperium: Secondary | ICD-10-CM | POA: Diagnosis present

## 2015-07-23 DIAGNOSIS — Z283 Underimmunization status: Secondary | ICD-10-CM

## 2015-07-23 DIAGNOSIS — O09899 Supervision of other high risk pregnancies, unspecified trimester: Secondary | ICD-10-CM

## 2015-07-23 DIAGNOSIS — K5909 Other constipation: Secondary | ICD-10-CM

## 2015-07-23 DIAGNOSIS — Z3A33 33 weeks gestation of pregnancy: Secondary | ICD-10-CM | POA: Insufficient documentation

## 2015-07-23 DIAGNOSIS — O4703 False labor before 37 completed weeks of gestation, third trimester: Principal | ICD-10-CM | POA: Insufficient documentation

## 2015-07-23 DIAGNOSIS — Z3490 Encounter for supervision of normal pregnancy, unspecified, unspecified trimester: Secondary | ICD-10-CM

## 2015-07-23 LAB — URINALYSIS COMPLETE WITH MICROSCOPIC (ARMC ONLY)
Bilirubin Urine: NEGATIVE
Glucose, UA: 50 mg/dL — AB
Hgb urine dipstick: NEGATIVE
Ketones, ur: NEGATIVE mg/dL
Nitrite: NEGATIVE
Protein, ur: 30 mg/dL — AB
Specific Gravity, Urine: 1.021 (ref 1.005–1.030)
pH: 6 (ref 5.0–8.0)

## 2015-07-23 LAB — FETAL FIBRONECTIN: Fetal Fibronectin: NEGATIVE

## 2015-07-23 NOTE — OB Triage Note (Signed)
Presents with ctx since early this morning around 2AM.  Pt has not timed ctx.  Pt reports + FM, denies LOF, VB, urinary urgency/frequency or discomfort with urination.

## 2015-07-23 NOTE — Final Progress Note (Signed)
L&D OB Triage Note  SUBJECTIVE Krystal Harrison is a 30 y.o. [id number] female at [redacted]w[redacted]d, EDD Estimated Date of Delivery: 09/07/15 who presented to triage with complaints of contractions. No UTI sxs. Good FM No vaginal bleeding or ROM    OBJECTIVE Nursing Evaluation: BP 119/63 mmHg  Pulse 99  Temp(Src) 98.5 F (36.9 C) (Oral)  Resp 20  Ht 5\' 5"  (1.651 m)  Wt 234 lb (106.142 kg)  BMI 38.94 kg/m2  LMP 12/01/2014 no significant findings. FFN: NEGATIVE GBS: Pending  NST was performed and has been reviewed by me.  NST INTERPRETATION: Indications: Contractions  Mode: External Baseline Rate (A): 130 bpm Variability: Moderate Accelerations: 15 x 15 Decelerations: None     Contraction Frequency (min): irritabilty; ripples  ASSESSMENT Impression:  1. Pregnancy:  [id number] at [redacted]w[redacted]d , EDD Estimated Date of Delivery: 09/07/15 2.  Reactive NST 3.  FFN: NEGATIVE 4. GBS pending  PLAN 1. Reassurance given 2. Discharge home with bleeding/labor precautions.  3. Continue routine prenatal care.   Brayton Mars, MD

## 2015-07-25 LAB — URINE CULTURE: Culture: 4000 — AB

## 2015-07-25 LAB — CULTURE, BETA STREP (GROUP B ONLY)

## 2015-08-03 ENCOUNTER — Encounter: Payer: Medicaid Other | Admitting: Obstetrics and Gynecology

## 2015-08-03 ENCOUNTER — Ambulatory Visit (INDEPENDENT_AMBULATORY_CARE_PROVIDER_SITE_OTHER): Payer: Medicaid Other | Admitting: Obstetrics and Gynecology

## 2015-08-03 VITALS — BP 126/82 | HR 98 | Wt 238.0 lb

## 2015-08-03 DIAGNOSIS — Z3493 Encounter for supervision of normal pregnancy, unspecified, third trimester: Secondary | ICD-10-CM

## 2015-08-03 LAB — POCT URINALYSIS DIPSTICK
Bilirubin, UA: NEGATIVE
Glucose, UA: NEGATIVE
Ketones, UA: NEGATIVE
Leukocytes, UA: NEGATIVE
Nitrite, UA: NEGATIVE
Spec Grav, UA: 1.01
Urobilinogen, UA: 0.2
pH, UA: 7

## 2015-08-03 NOTE — Progress Notes (Signed)
ROB doing well, cultures next visit. 

## 2015-08-03 NOTE — Progress Notes (Signed)
ROB-pt is having some pelvic pressure, menstrual cramping She is itching all over

## 2015-08-09 ENCOUNTER — Observation Stay
Admission: EM | Admit: 2015-08-09 | Discharge: 2015-08-10 | Disposition: A | Discharge status: Home / Self Care | Payer: Medicaid Other | Attending: Obstetrics and Gynecology | Admitting: Obstetrics and Gynecology

## 2015-08-09 DIAGNOSIS — O4703 False labor before 37 completed weeks of gestation, third trimester: Principal | ICD-10-CM | POA: Insufficient documentation

## 2015-08-09 DIAGNOSIS — O9989 Other specified diseases and conditions complicating pregnancy, childbirth and the puerperium: Secondary | ICD-10-CM

## 2015-08-09 DIAGNOSIS — Z283 Underimmunization status: Secondary | ICD-10-CM

## 2015-08-09 DIAGNOSIS — K5909 Other constipation: Secondary | ICD-10-CM

## 2015-08-09 DIAGNOSIS — Z3A35 35 weeks gestation of pregnancy: Secondary | ICD-10-CM | POA: Insufficient documentation

## 2015-08-09 DIAGNOSIS — Z2839 Other underimmunization status: Secondary | ICD-10-CM

## 2015-08-09 NOTE — OB Triage Note (Signed)
Patient arrived to triage c/o ctx's x approx 3 hours. States positive fetal movement, denies leaking of fluid and vaginal bleeding.

## 2015-08-10 DIAGNOSIS — O4703 False labor before 37 completed weeks of gestation, third trimester: Secondary | ICD-10-CM | POA: Diagnosis not present

## 2015-08-10 DIAGNOSIS — Z3A35 35 weeks gestation of pregnancy: Secondary | ICD-10-CM | POA: Diagnosis not present

## 2015-08-10 MED ORDER — ACETAMINOPHEN 325 MG PO TABS
650.0000 mg | ORAL_TABLET | ORAL | Status: DC | PRN
  Administered 2015-08-10: 650 mg via ORAL

## 2015-08-10 MED ORDER — ACETAMINOPHEN 325 MG PO TABS
ORAL_TABLET | ORAL | Status: AC
  Administered 2015-08-10: 650 mg via ORAL
  Filled 2015-08-10: qty 2

## 2015-08-11 NOTE — Final Progress Note (Signed)
L&D OB Triage Note  Krystal Harrison is a 30 y.o. [id number] female at [redacted]w[redacted]d, EDD Estimated Date of Delivery: 09/07/15 who presented to triage for complaints of contractions x 3 hours.  She was evaluated by the nurses with no significant findings for preterm labor. Vital signs stable. An NST was performed and has been reviewed by MD. She was treated with PO hydration and Tylenol ES.     Physical Exam:  Blood pressure 135/69, pulse 97, temperature 98.3 F (36.8 C), temperature source Oral, height 5\' 5"  (1.651 m), weight 238 lb (108 kg), last menstrual period 12/01/2014. Gen App: NAD Cervix: deferred  NST INTERPRETATION: Indications: rule out uterine contractions  Mode: External Baseline Rate (A): 125 bpm Variability: Moderate Accelerations: 15 x 15 Decelerations: None     Contraction Frequency (min): irritability  Impression: reactive   Plan: NST performed was reviewed and was found to be reactive. She was discharged home with bleeding/labor precautions.  Continue routine prenatal care. Follow up with OB/GYN as previously scheduled.     Rubie Maid, MD

## 2015-08-16 ENCOUNTER — Ambulatory Visit (INDEPENDENT_AMBULATORY_CARE_PROVIDER_SITE_OTHER): Payer: Medicaid Other | Admitting: Obstetrics and Gynecology

## 2015-08-16 VITALS — BP 115/71 | HR 82 | Wt 243.5 lb

## 2015-08-16 DIAGNOSIS — Z113 Encounter for screening for infections with a predominantly sexual mode of transmission: Secondary | ICD-10-CM

## 2015-08-16 DIAGNOSIS — Z3493 Encounter for supervision of normal pregnancy, unspecified, third trimester: Secondary | ICD-10-CM

## 2015-08-16 DIAGNOSIS — Z36 Encounter for antenatal screening of mother: Secondary | ICD-10-CM

## 2015-08-16 DIAGNOSIS — Z3685 Encounter for antenatal screening for Streptococcus B: Secondary | ICD-10-CM

## 2015-08-16 LAB — POCT URINALYSIS DIPSTICK
Blood, UA: NEGATIVE
Glucose, UA: NEGATIVE
Ketones, UA: NEGATIVE
Leukocytes, UA: NEGATIVE
Nitrite, UA: NEGATIVE
Spec Grav, UA: 1.015
Urobilinogen, UA: 0.2
pH, UA: 6.5

## 2015-08-16 NOTE — Progress Notes (Signed)
ROB- discussed weight gain, cultures obtained.

## 2015-08-16 NOTE — Progress Notes (Signed)
ROB- cultures obtained, pt is having a lot of pelvic pressure

## 2015-08-18 LAB — GC/CHLAMYDIA PROBE AMP
Chlamydia trachomatis, NAA: NEGATIVE
Neisseria gonorrhoeae by PCR: NEGATIVE

## 2015-08-18 LAB — STREP GP B NAA: Strep Gp B NAA: NEGATIVE

## 2015-08-23 ENCOUNTER — Ambulatory Visit (INDEPENDENT_AMBULATORY_CARE_PROVIDER_SITE_OTHER): Payer: Medicaid Other | Admitting: Obstetrics and Gynecology

## 2015-08-23 VITALS — BP 117/74 | HR 91 | Wt 244.7 lb

## 2015-08-23 DIAGNOSIS — Z3493 Encounter for supervision of normal pregnancy, unspecified, third trimester: Secondary | ICD-10-CM

## 2015-08-23 LAB — POCT URINALYSIS DIPSTICK
Bilirubin, UA: NEGATIVE
Glucose, UA: NEGATIVE
Ketones, UA: NEGATIVE
Leukocytes, UA: NEGATIVE
Nitrite, UA: NEGATIVE
Spec Grav, UA: 1.015
Urobilinogen, UA: 0.2
pH, UA: 6.5

## 2015-08-23 NOTE — Progress Notes (Signed)
ROB- pt is having lots of pelvic pressure, low back pain

## 2015-08-23 NOTE — Progress Notes (Signed)
ROB- miserable; unable to sleep, desires elective induction- will plan 8/28 if not delivered by then.

## 2015-08-24 ENCOUNTER — Inpatient Hospital Stay
Admission: EM | Admit: 2015-08-24 | Discharge: 2015-08-24 | Disposition: A | Discharge status: Home / Self Care | Payer: Medicaid Other | Attending: Obstetrics and Gynecology | Admitting: Obstetrics and Gynecology

## 2015-08-24 ENCOUNTER — Observation Stay
Admission: EM | Admit: 2015-08-24 | Discharge: 2015-08-24 | Disposition: A | Discharge status: Home / Self Care | Payer: Medicaid Other | Attending: Obstetrics and Gynecology | Admitting: Obstetrics and Gynecology

## 2015-08-24 ENCOUNTER — Encounter: Payer: Self-pay | Admitting: *Deleted

## 2015-08-24 DIAGNOSIS — Z3490 Encounter for supervision of normal pregnancy, unspecified, unspecified trimester: Secondary | ICD-10-CM

## 2015-08-24 DIAGNOSIS — Z2839 Other underimmunization status: Secondary | ICD-10-CM

## 2015-08-24 DIAGNOSIS — Z283 Underimmunization status: Secondary | ICD-10-CM

## 2015-08-24 DIAGNOSIS — Z3493 Encounter for supervision of normal pregnancy, unspecified, third trimester: Principal | ICD-10-CM | POA: Insufficient documentation

## 2015-08-24 DIAGNOSIS — O9989 Other specified diseases and conditions complicating pregnancy, childbirth and the puerperium: Secondary | ICD-10-CM

## 2015-08-24 DIAGNOSIS — K5909 Other constipation: Secondary | ICD-10-CM

## 2015-08-24 NOTE — OB Triage Provider Note (Cosign Needed)
L&D OB Triage Note  Krystal Harrison is a 30 y.o. [id number] female at [redacted]w[redacted]d, EDD Estimated Date of Delivery: 09/07/15 who presented to triage for complaints of contractions and bloody show since 930am.  She was evaluated by the nurses with no significant findings for labor. Cervix was no change from last exam- 3/70/OOP Vital signs stable. An NST was performed and has been reviewed by Me.    NST INTERPRETATION: Indications: rule out uterine contractions    Baseline Rate (A): 135 bpm Variability: Moderate Accelerations: 15 x 15 Decelerations: None     Contraction Frequency (min): x1  Impression: reactive   Plan: NST performed was reviewed and was found to be reactive. She was discharged home with bleeding/labor precautions.  Continue routine prenatal care. Follow up with OB/GYN as previously scheduled.     Melody Rockney Ghee, CNM

## 2015-08-24 NOTE — OB Triage Note (Signed)
Recvd pt from ED. C/O contractions every 2-6 min lasting for 2 hours. Feeling baby move ok, denies intercourse in past 24 hours. Changed into gown and EFM applied.

## 2015-08-24 NOTE — OB Triage Note (Signed)
Patient states lower back pain and pelvic pressure / cramping, starting yesterday. Pressure started 1030.   Mucous and bloody show noted since 0930.  States she was 3cm at 0200 this am.  Denies ROM.

## 2015-08-24 NOTE — Discharge Instructions (Signed)
Come back if:  Big gush of fluid Temp over 100.4 Heavy vaginal bleeding Decreased fetal movement Contractions every 3-5 min lasting at least one hour  Get plenty of rest and drink plenty of fluids!

## 2015-08-27 ENCOUNTER — Inpatient Hospital Stay
Admission: EM | Admit: 2015-08-27 | Discharge: 2015-08-27 | Disposition: A | Discharge status: Home / Self Care | Payer: Medicaid Other | Attending: Obstetrics and Gynecology | Admitting: Obstetrics and Gynecology

## 2015-08-27 DIAGNOSIS — K5909 Other constipation: Secondary | ICD-10-CM

## 2015-08-27 DIAGNOSIS — O09899 Supervision of other high risk pregnancies, unspecified trimester: Secondary | ICD-10-CM

## 2015-08-27 DIAGNOSIS — Z283 Underimmunization status: Secondary | ICD-10-CM

## 2015-08-27 DIAGNOSIS — O9989 Other specified diseases and conditions complicating pregnancy, childbirth and the puerperium: Principal | ICD-10-CM

## 2015-08-27 NOTE — Discharge Instructions (Signed)

## 2015-08-27 NOTE — OB Triage Note (Signed)
Krystal Harrison here with c/o lower back pain that started last PM, states pain is every 2-3 minutes, not sure how long pain lasts. Also reports ctx. Denies bleeding, LOF. Positive fetal movement.

## 2015-08-28 ENCOUNTER — Inpatient Hospital Stay
Admission: EM | Admit: 2015-08-28 | Discharge: 2015-08-31 | DRG: 775 | Disposition: A | Discharge status: Home / Self Care | Payer: Medicaid Other | Attending: Obstetrics and Gynecology | Admitting: Obstetrics and Gynecology

## 2015-08-28 DIAGNOSIS — Z833 Family history of diabetes mellitus: Secondary | ICD-10-CM

## 2015-08-28 DIAGNOSIS — O9081 Anemia of the puerperium: Secondary | ICD-10-CM | POA: Diagnosis present

## 2015-08-28 DIAGNOSIS — K5909 Other constipation: Secondary | ICD-10-CM

## 2015-08-28 DIAGNOSIS — O2243 Hemorrhoids in pregnancy, third trimester: Principal | ICD-10-CM | POA: Diagnosis present

## 2015-08-28 DIAGNOSIS — Z3490 Encounter for supervision of normal pregnancy, unspecified, unspecified trimester: Secondary | ICD-10-CM

## 2015-08-28 DIAGNOSIS — Z9889 Other specified postprocedural states: Secondary | ICD-10-CM

## 2015-08-28 DIAGNOSIS — Z79899 Other long term (current) drug therapy: Secondary | ICD-10-CM

## 2015-08-28 DIAGNOSIS — Z3A38 38 weeks gestation of pregnancy: Secondary | ICD-10-CM

## 2015-08-29 DIAGNOSIS — Z79899 Other long term (current) drug therapy: Secondary | ICD-10-CM | POA: Diagnosis not present

## 2015-08-29 DIAGNOSIS — Z9889 Other specified postprocedural states: Secondary | ICD-10-CM | POA: Diagnosis not present

## 2015-08-29 DIAGNOSIS — Z833 Family history of diabetes mellitus: Secondary | ICD-10-CM | POA: Diagnosis not present

## 2015-08-29 DIAGNOSIS — Z3493 Encounter for supervision of normal pregnancy, unspecified, third trimester: Secondary | ICD-10-CM | POA: Diagnosis not present

## 2015-08-29 DIAGNOSIS — O9081 Anemia of the puerperium: Secondary | ICD-10-CM | POA: Diagnosis present

## 2015-08-29 DIAGNOSIS — Z3A38 38 weeks gestation of pregnancy: Secondary | ICD-10-CM | POA: Diagnosis not present

## 2015-08-29 DIAGNOSIS — O2243 Hemorrhoids in pregnancy, third trimester: Secondary | ICD-10-CM | POA: Diagnosis present

## 2015-08-29 LAB — CBC
HCT: 30.7 % — ABNORMAL LOW (ref 35.0–47.0)
Hemoglobin: 10.1 g/dL — ABNORMAL LOW (ref 12.0–16.0)
MCH: 24 pg — ABNORMAL LOW (ref 26.0–34.0)
MCHC: 33 g/dL (ref 32.0–36.0)
MCV: 72.8 fL — ABNORMAL LOW (ref 80.0–100.0)
Platelets: 254 10*3/uL (ref 150–440)
RBC: 4.22 MIL/uL (ref 3.80–5.20)
RDW: 17 % — ABNORMAL HIGH (ref 11.5–14.5)
WBC: 15 10*3/uL — ABNORMAL HIGH (ref 3.6–11.0)

## 2015-08-29 LAB — TYPE AND SCREEN
ABO/RH(D): A POS
Antibody Screen: NEGATIVE

## 2015-08-29 MED ORDER — LIDOCAINE HCL (PF) 1 % IJ SOLN
INTRAMUSCULAR | Status: AC
  Filled 2015-08-29: qty 30

## 2015-08-29 MED ORDER — MISOPROSTOL 200 MCG PO TABS
ORAL_TABLET | ORAL | Status: AC
  Filled 2015-08-29: qty 4

## 2015-08-29 MED ORDER — DOCUSATE SODIUM 100 MG PO CAPS
100.0000 mg | ORAL_CAPSULE | Freq: Two times a day (BID) | ORAL | Status: DC
  Administered 2015-08-29 – 2015-08-31 (×4): 100 mg via ORAL
  Filled 2015-08-29 (×4): qty 1

## 2015-08-29 MED ORDER — SOD CITRATE-CITRIC ACID 500-334 MG/5ML PO SOLN: 30.0000 mL | ORAL | Status: DC | PRN

## 2015-08-29 MED ORDER — LACTATED RINGERS IV SOLN: INTRAVENOUS | Status: DC

## 2015-08-29 MED ORDER — OXYTOCIN BOLUS FROM INFUSION
500.0000 mL | Freq: Once | INTRAVENOUS | Status: AC
  Administered 2015-08-29: 500 mL via INTRAVENOUS

## 2015-08-29 MED ORDER — PRENATAL MULTIVITAMIN CH
1.0000 | ORAL_TABLET | Freq: Every day | ORAL | Status: DC
  Administered 2015-08-29 – 2015-08-31 (×3): 1 via ORAL
  Filled 2015-08-29 (×3): qty 1

## 2015-08-29 MED ORDER — MEASLES, MUMPS & RUBELLA VAC ~~LOC~~ INJ
0.5000 mL | INJECTION | Freq: Once | SUBCUTANEOUS | Status: DC
  Filled 2015-08-29: qty 0.5

## 2015-08-29 MED ORDER — TERBUTALINE SULFATE 1 MG/ML IJ SOLN: 0.2500 mg | Freq: Once | INTRAMUSCULAR | Status: DC | PRN

## 2015-08-29 MED ORDER — DIBUCAINE 1 % RE OINT
1.0000 "application " | TOPICAL_OINTMENT | RECTAL | Status: DC | PRN
  Administered 2015-08-30: 1 via RECTAL
  Filled 2015-08-29: qty 28

## 2015-08-29 MED ORDER — OXYTOCIN 40 UNITS IN LACTATED RINGERS INFUSION - SIMPLE MED
1.0000 m[IU]/min | INTRAVENOUS | Status: DC
  Filled 2015-08-29: qty 1000

## 2015-08-29 MED ORDER — OXYCODONE HCL 5 MG PO TABS: 5.0000 mg | ORAL_TABLET | ORAL | Status: DC | PRN

## 2015-08-29 MED ORDER — ACYCLOVIR 200 MG PO CAPS
400.0000 mg | ORAL_CAPSULE | Freq: Two times a day (BID) | ORAL | Status: DC
  Administered 2015-08-29 – 2015-08-31 (×5): 400 mg via ORAL
  Filled 2015-08-29 (×5): qty 2

## 2015-08-29 MED ORDER — ONDANSETRON HCL 4 MG/2ML IJ SOLN: 4.0000 mg | Freq: Four times a day (QID) | INTRAMUSCULAR | Status: DC | PRN

## 2015-08-29 MED ORDER — OXYTOCIN 10 UNIT/ML IJ SOLN
INTRAMUSCULAR | Status: AC
Start: 2015-08-29 — End: 2015-08-29
  Filled 2015-08-29: qty 2

## 2015-08-29 MED ORDER — SIMETHICONE 80 MG PO CHEW: 80.0000 mg | CHEWABLE_TABLET | ORAL | Status: DC | PRN

## 2015-08-29 MED ORDER — IBUPROFEN 600 MG PO TABS
600.0000 mg | ORAL_TABLET | Freq: Four times a day (QID) | ORAL | Status: DC
  Administered 2015-08-29 – 2015-08-31 (×9): 600 mg via ORAL
  Filled 2015-08-29 (×9): qty 1

## 2015-08-29 MED ORDER — DIPHENHYDRAMINE HCL 25 MG PO CAPS: 25.0000 mg | ORAL_CAPSULE | Freq: Four times a day (QID) | ORAL | Status: DC | PRN

## 2015-08-29 MED ORDER — OXYCODONE HCL 5 MG PO TABS
10.0000 mg | ORAL_TABLET | ORAL | Status: DC | PRN
Start: 2015-08-29 — End: 2015-08-31

## 2015-08-29 MED ORDER — OXYTOCIN 40 UNITS IN LACTATED RINGERS INFUSION - SIMPLE MED: 2.5000 [IU]/h | INTRAVENOUS | Status: DC

## 2015-08-29 MED ORDER — ONDANSETRON HCL 4 MG/2ML IJ SOLN: 4.0000 mg | INTRAMUSCULAR | Status: DC | PRN

## 2015-08-29 MED ORDER — ONDANSETRON HCL 4 MG PO TABS: 4.0000 mg | ORAL_TABLET | ORAL | Status: DC | PRN

## 2015-08-29 MED ORDER — COCONUT OIL OIL
1.0000 "application " | TOPICAL_OIL | Status: DC | PRN
  Filled 2015-08-29: qty 120

## 2015-08-29 MED ORDER — SODIUM CHLORIDE FLUSH 0.9 % IV SOLN
INTRAVENOUS | Status: AC
  Filled 2015-08-29: qty 10

## 2015-08-29 MED ORDER — AMMONIA AROMATIC IN INHA
RESPIRATORY_TRACT | Status: AC
  Filled 2015-08-29: qty 10

## 2015-08-29 MED ORDER — OXYCODONE-ACETAMINOPHEN 5-325 MG PO TABS: 2.0000 | ORAL_TABLET | ORAL | Status: DC | PRN

## 2015-08-29 MED ORDER — SENNOSIDES-DOCUSATE SODIUM 8.6-50 MG PO TABS
2.0000 | ORAL_TABLET | ORAL | Status: DC
  Administered 2015-08-30: 2 via ORAL
  Filled 2015-08-29: qty 2

## 2015-08-29 MED ORDER — ACETAMINOPHEN 325 MG PO TABS: 650.0000 mg | ORAL_TABLET | ORAL | Status: DC | PRN

## 2015-08-29 MED ORDER — WITCH HAZEL-GLYCERIN EX PADS
1.0000 "application " | MEDICATED_PAD | CUTANEOUS | Status: DC | PRN
  Administered 2015-08-29: 1 via TOPICAL
  Filled 2015-08-29: qty 100

## 2015-08-29 MED ORDER — LIDOCAINE HCL (PF) 1 % IJ SOLN: 30.0000 mL | INTRAMUSCULAR | Status: DC | PRN

## 2015-08-29 MED ORDER — FENTANYL CITRATE (PF) 100 MCG/2ML IJ SOLN
50.0000 ug | INTRAMUSCULAR | Status: DC | PRN
  Administered 2015-08-29 (×2): 100 ug via INTRAVENOUS
  Filled 2015-08-29 (×2): qty 2

## 2015-08-29 MED ORDER — OXYCODONE-ACETAMINOPHEN 5-325 MG PO TABS: 1.0000 | ORAL_TABLET | ORAL | Status: DC | PRN

## 2015-08-29 MED ORDER — BENZOCAINE-MENTHOL 20-0.5 % EX AERO
1.0000 "application " | INHALATION_SPRAY | CUTANEOUS | Status: DC | PRN
  Administered 2015-08-29: 1 via TOPICAL
  Filled 2015-08-29: qty 56

## 2015-08-29 MED ORDER — LACTATED RINGERS IV SOLN: 500.0000 mL | INTRAVENOUS | Status: DC | PRN

## 2015-08-29 NOTE — H&P (Signed)
Obstetric History and Physical  Krystal Harrison is a 30 y.o. [phone] with IUP at [redacted]w[redacted]d presenting with ROM and irregular contractions. Patient states she has been having  irregular, every 5-6 minutes contractions, none vaginal bleeding, ruptured, clear fluid membranes, with active fetal movement.    Prenatal Course Source of Care: [facility]  Pregnancy complications or risks:none  Prenatal labs and studies: ABO, Rh: --/--/A POS (08/22 0125) Antibody: NEG (08/22 0125) Rubella: <20.0 (01/06 1134) RPR: Non Reactive (01/06 1134)  HBsAg: Negative (01/06 1134)  HIV: Non Reactive (01/06 1134)  [id number] (08/09 1216) 1 hr Glucola  normal Genetic screening normal Anatomy US normal  Past Medical History:  Diagnosis Date  . Herpes genitalia    last outbreak 1-2 yrs ago    Past Surgical History:  Procedure Laterality Date  . EYE SURGERY    . EYE SURGERY Bilateral     OB History  Gravida Para Term Preterm AB Living  5 3 3  0 1 3  SAB TAB Ectopic Multiple Live Births  1 0 0 0 3    # Outcome Date GA Lbr Len/2nd Weight Sex Delivery Anes PTL Lv  5 Current           4 SAB 2016        FD  3 Term 09/12/11 [redacted]w[redacted]d  8 lb 12 oz (3.969 kg) F Vag-Spont  Y LIV  2 Term 12/31/07 [redacted]w[redacted]d  7 lb 14 oz (3.572 kg) M Vag-Spont  N LIV  1 Term 09/07/04 [redacted]w[redacted]d  7 lb 15 oz (3.6 kg) F Vag-Spont   LIV    Obstetric Comments  09/12/2011 pt leaking fluid at 32wks, hospitalized until delivery at 38wks.    Social History   Social History  . Marital status: Single    Spouse name: N/A  . Number of children: N/A  . Years of education: N/A   Social History Main Topics  . Smoking status: Never Smoker  . Smokeless tobacco: Never Used  . Alcohol use No  . Drug use: No  . Sexual activity: Yes    Birth control/ protection: None   Other Topics Concern  . Not on file   Social History Narrative  . No narrative on file    Family History  Problem Relation Age of Onset  . Diabetes Mother   . Anesthesia problems  Neg Hx     Prescriptions Prior to Admission  Medication Sig Dispense Refill Last Dose  . acyclovir (ZOVIRAX) 400 MG tablet Take 1 tablet (400 mg total) by mouth 2 (two) times daily. 60 tablet 6 08/24/2015  . Iron-FA-B Cmp-C-Biot-Probiotic (FUSION PLUS) CAPS Take 1 capsule by mouth daily. (Patient not taking: Reported on 07/23/2015) 60 capsule 1 Not Taking at Unknown time  . Prenatal Vit-Fe Fumarate-FA (MULTIVITAMIN-PRENATAL) 27-0.8 MG TABS tablet Take 1 tablet by mouth daily at 12 noon.   08/17/2015  . PROCTOSOL HC 2.5 % rectal cream Reported on 07/23/2015  1 Not Taking at Unknown time    No Known Allergies  Review of Systems: Negative except for what is mentioned in HPI.  Physical Exam: Temp 98.1 F (36.7 C) (Oral)   Resp 18   LMP 12/01/2014  GENERAL: Well-developed, well-nourished female in no acute distress.  LUNGS: Clear to auscultation bilaterally.  HEART: Regular rate and rhythm. ABDOMEN: Soft, nontender, nondistended, gravid. EXTREMITIES: Nontender, no edema, 2+ distal pulses. Cervical Exam: Dilation: 10 Effacement (%): 100 Cervical Position: Middle Station: 0 Exam by:: gaccione FHT:  Baseline rate 144 bpm  Variability moderate  Accelerations present   Decelerations none Contractions: Every 4-6 mins   Pertinent Labs/Studies:   Results for orders placed or performed during the hospital encounter of 08/28/15 (from the past 24 hour(s))  CBC     Status: Abnormal   Collection Time: 08/29/15  1:25 AM  Result Value Ref Range   WBC 15.0 (H) 3.6 - 11.0 K/uL   RBC 4.22 3.80 - 5.20 MIL/uL   Hemoglobin 10.1 (L) 12.0 - 16.0 g/dL   HCT 30.7 (L) 35.0 - 47.0 %   MCV 72.8 (L) 80.0 - 100.0 fL   MCH 24.0 (L) 26.0 - 34.0 pg   MCHC 33.0 32.0 - 36.0 g/dL   RDW 17.0 (H) 11.5 - 14.5 %   Platelets 254 150 - 440 K/uL  Type and screen     Status: None   Collection Time: 08/29/15  1:25 AM  Result Value Ref Range   ABO/RH(D) A POS    Antibody Screen NEG    Sample Expiration 09/01/2015      Assessment : Krystal Harrison is a 30 y.o. [id number] at [redacted]w[redacted]d being admitted for labor.  Plan: Labor: Expectant management.  Induction/Augmentation as needed, per protocol FWB: Reassuring fetal heart tracing.  GBS negative Delivery plan: Hopeful for vaginal delivery  Melody Shambley, CNM [facility]

## 2015-08-30 LAB — RPR: RPR Ser Ql: NONREACTIVE

## 2015-08-30 LAB — CBC
HCT: 27.5 % — ABNORMAL LOW (ref 35.0–47.0)
Hemoglobin: 9.1 g/dL — ABNORMAL LOW (ref 12.0–16.0)
MCH: 24.3 pg — ABNORMAL LOW (ref 26.0–34.0)
MCHC: 33.1 g/dL (ref 32.0–36.0)
MCV: 73.3 fL — ABNORMAL LOW (ref 80.0–100.0)
Platelets: 240 10*3/uL (ref 150–440)
RBC: 3.76 MIL/uL — ABNORMAL LOW (ref 3.80–5.20)
RDW: 17.2 % — ABNORMAL HIGH (ref 11.5–14.5)
WBC: 16.3 10*3/uL — ABNORMAL HIGH (ref 3.6–11.0)

## 2015-08-30 MED ORDER — FERROUS SULFATE 325 (65 FE) MG PO TABS
325.0000 mg | ORAL_TABLET | Freq: Three times a day (TID) | ORAL | Status: DC
  Administered 2015-08-30 – 2015-08-31 (×4): 325 mg via ORAL
  Filled 2015-08-30 (×4): qty 1

## 2015-08-30 NOTE — Lactation Note (Signed)
This note was copied from a baby's chart. Lactation Consultation Note  Patient Name: Krystal Harrison [id number] Date: 08/30/2015 Reason for consult: Follow-up assessment   Maternal Data  Left nipple oblong. Upper edge of nipple quite swollen with positional stripe. Baby had been sucking on only part of nipple. I helped Mom with a different postion to change the angle of the latch so she would latch onto the horizontal nipple (instead of vertically, making her get upper edge). Mom states that it felt better and we heard more swallows, especially with breast compression. Mom to call for help when ready to try to latch to right side which is inverted. To wear shells and pre pump. If unable to latch to right side, pump every feeding session.   Feeding Feeding Type: Breast Fed Length of feed: 10 min  LATCH Score/Interventions Latch: Repeated attempts needed to sustain latch, nipple held in mouth throughout feeding, stimulation needed to elicit sucking reflex. (baby had been sucking on only upper edge of large nipple) Intervention(s): Adjust position;Assist with latch  Audible Swallowing: A few with stimulation Intervention(s): Hand expression  Type of Nipple: Inverted (right inverted; left flat with swollen edge top) Intervention(s):  (changed position/latch to fit all of nipple in mouth. )  Comfort (Breast/Nipple): Soft / non-tender     Hold (Positioning): Assistance needed to correctly position infant at breast and maintain latch. Intervention(s): Support Pillows  LATCH Score: 5  Lactation Tools Discussed/Used     Consult Status Consult Status: Follow-up Date: 08/31/15 Follow-up type: In-patient    Roque Cash 08/30/2015, 6:54 PM

## 2015-08-30 NOTE — Progress Notes (Signed)
Post Partum Day 1 Subjective: up ad lib, voiding, tolerating PO and complaining of hemarrhoids discomfort.  Objective: Blood pressure 108/60, pulse 95, temperature 97.8 F (36.6 C), temperature source Oral, resp. rate 18, last menstrual period 12/01/2014, SpO2 98 %, unknown if currently breastfeeding.  Physical Exam:  General: alert and cooperative Lochia: appropriate Uterine Fundus: firm Incision: NA DVT Evaluation: No evidence of DVT seen on physical exam.   Recent Labs  08/29/15 0125 08/30/15 0505  HGB 10.1* 9.1*  HCT 30.7* 27.5*    Assessment/Plan: Plan for discharge tomorrow and Breastfeeding  Post Partum Anemia  Iron BID, Stool Softeners   LOS: 1 day   Alanda Slim Amada Hallisey 08/30/2015, 10:29 AM

## 2015-08-31 MED ORDER — DOCUSATE SODIUM 100 MG PO CAPS: 100.0000 mg | ORAL_CAPSULE | Freq: Two times a day (BID) | ORAL | 0 refills | Status: DC

## 2015-08-31 MED ORDER — IBUPROFEN 800 MG PO TABS: 800.0000 mg | ORAL_TABLET | Freq: Three times a day (TID) | ORAL | 1 refills | Status: DC

## 2015-08-31 MED ORDER — FERROUS SULFATE 325 (65 FE) MG PO TABS: 325.0000 mg | ORAL_TABLET | Freq: Two times a day (BID) | ORAL | 0 refills | Status: DC

## 2015-08-31 MED ORDER — OXYCODONE HCL 5 MG PO TABS: 5.0000 mg | ORAL_TABLET | ORAL | 0 refills | Status: DC | PRN

## 2015-08-31 NOTE — Progress Notes (Signed)
Patient understands all discharge instructions and the need to make follow up appointments. Patient discharge via wheelchair with auxillary. 

## 2015-08-31 NOTE — Lactation Note (Signed)
This note was copied from a baby's chart. Lactation Consultation Note  Patient Name: Krystal Harrison [id number] Date: 08/31/2015 Reason for consult: Follow-up assessment   Maternal Data    Feeding Feeding Type: Breast Fed Length of feed: 45 min  LATCH Score/Interventions Latch: Grasps breast easily, tongue down, lips flanged, rhythmical sucking. Intervention(s): Teach feeding cues Intervention(s): Adjust position;Assist with latch  Audible Swallowing: A few with stimulation Intervention(s): Hand expression                 Lactation Tools Discussed/Used WIC Program: Yes   Consult Status      Daryel November 08/31/2015, 11:41 AM

## 2015-08-31 NOTE — Discharge Summary (Signed)
Physician Obstetric Discharge Summary  Patient ID: Krystal Harrison MRN: [id number] DOB/AGE: [birth year] 29 y.o.   Date of Admission: 08/28/2015  Date of Discharge: 08/31/2015  Admitting Diagnosis: Onset of Labor at [redacted]w[redacted]d  Secondary Diagnosis: none  Mode of Delivery: normal spontaneous vaginal delivery Discharge Diagnosis: TIUP, Delivered; Anemia; Hemorrhoids   Intrapartum Procedures: none   Post partum procedures: none  Complications: none   Brief Hospital Course  Krystal Harrison is a [id number] who had a SVD on 08/28/2015;  for further details of this surgery, please refer to the delivey note.  Patient had an uncomplicated postpartum course.  By time of discharge on PPD#2, her pain was controlled on oral pain medications; she had appropriate lochia and was ambulating, voiding without difficulty and tolerating regular diet.  She was deemed stable for discharge to home.    Labs: CBC Latest Ref Rng & Units 08/30/2015 08/29/2015 06/16/2015  WBC 3.6 - 11.0 K/uL 16.3(H) 15.0(H) -  Hemoglobin 12.0 - 16.0 g/dL 9.1(L) 10.1(L) -  Hematocrit 35.0 - 47.0 % 27.5(L) 30.7(L) 31.3(L)  Platelets 150 - 440 K/uL 240 254 -   A POS  Physical exam:  Blood pressure 114/60, pulse 87, temperature 98.6 F (37 C), temperature source Oral, resp. rate 18, height 5\' 5"  (1.651 m), weight 244 lb (110.7 kg), last menstrual period 12/01/2014, SpO2 98 %, unknown if currently breastfeeding. General: alert and no distress Lochia: appropriate Abdomen: soft, NT Uterine Fundus: firm Extremities: No evidence of DVT seen on physical exam. No lower extremity edema.  Discharge Instructions: Per After Visit Summary. Activity: Advance as tolerated. Pelvic rest for 6 weeks.  Also refer to After Visit Summary Diet: Regular Medications:PNV, FE, Colace,Percocet, Advil Outpatient follow up:  Follow-up Information    Melody Rockney Ghee, CNM. Go in 6 week(s).   Specialties:  Obstetrics and Gynecology, Radiology Why:  Post  Partum check Contact information: [address] [place] [address] [phone]          Postpartum contraception: condoms  Discharged Condition: good  Discharged to: home   Newborn Data: Disposition:home with mother  Apgars: APGAR (1 MIN): 8   APGAR (5 MINS): 9   APGAR (10 MINS):    Baby Feeding: Breast  Brayton Mars, MD

## 2015-09-11 NOTE — OB Triage Provider Note (Signed)
L&D OB Triage Note  Krystal Harrison is a 30 y.o. [id number] female at [redacted]w[redacted]d, EDD Estimated Date of Delivery: 09/07/15 who presented to triage for complaints of irregular contractions and pressure for the last two hours.  She was evaluated by the nurses with no significant findings/findings significant for labor. Vital signs stable. An NST was performed and has been reviewed by Me. She was treated with po hydration.   NST INTERPRETATION: Indications: rule out uterine contractions  Mode: External Baseline Rate (A): 135 bpm Variability: Moderate Accelerations: 15 x 15 Decelerations: None     Contraction Frequency (min): 3-6.5  Impression: reactive   Plan: NST performed was reviewed and was found to be reactive. She was discharged home with bleeding/labor precautions.  Continue routine prenatal care. Follow up with OB/GYN as previously scheduled.     Krystal Harrison, CNM

## 2015-09-13 ENCOUNTER — Telehealth: Payer: Self-pay | Admitting: Obstetrics and Gynecology

## 2015-09-13 NOTE — Telephone Encounter (Signed)
Advised pt to get some otc monistat, she voiced understanding

## 2015-09-13 NOTE — Telephone Encounter (Signed)
Yeast inf... Itching inside and around vaginal opening

## 2015-09-13 NOTE — Telephone Encounter (Signed)
Yeast inf... Itching inside and around vaginal opening// she wants something sent to [facility] if possible

## 2015-10-08 ENCOUNTER — Encounter: Payer: Self-pay | Admitting: Emergency Medicine

## 2015-10-08 ENCOUNTER — Emergency Department
Admission: EM | Admit: 2015-10-08 | Discharge: 2015-10-08 | Disposition: A | Discharge status: Home / Self Care | Payer: Medicaid Other | Attending: Student in an Organized Health Care Education/Training Program | Admitting: Student in an Organized Health Care Education/Training Program

## 2015-10-08 DIAGNOSIS — J029 Acute pharyngitis, unspecified: Secondary | ICD-10-CM | POA: Diagnosis not present

## 2015-10-08 LAB — POCT RAPID STREP A: Streptococcus, Group A Screen (Direct): NEGATIVE

## 2015-10-08 NOTE — ED Triage Notes (Signed)
Sore throat that started last night. Here with her children that have same symptoms

## 2015-10-10 NOTE — ED Provider Notes (Signed)
[facility] Emergency Department Provider Note ____________________________________________  Time seen: 1831  I have reviewed the triage vital signs and the nursing notes.  HISTORY  Chief Complaint  Sore Throat  HPI Krystal Harrison is a 30 y.o. female presents to the ED for evaluation of sore throat with onset last night. She denies interim fevers, chills, sweats, or cough. She also denies sick contacts, recent travel, or exposures. She has not dosed any OTC meds for symptom relief. She reports only mild nasal drainage.   Past Medical History:  Diagnosis Date  . Herpes genitalia    last outbreak 1-2 yrs ago    Patient Active Problem List   Diagnosis Date Noted  . Labor and delivery, indication for care 08/29/2015  . Indication for care in labor or delivery 08/10/2015  . Pregnancy 07/23/2015  . Increased BMI 07/19/2015  . History of preterm PROM in previous pregnancy, currently pregnant 07/19/2015  . 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 Anemia 06/21/2015  . Hemorrhoid 05/18/2015  . Rubella non-immune status, antepartum 02/22/2015  . Chronic constipation 01/31/2015    Past Surgical History:  Procedure Laterality Date  . EYE SURGERY    . EYE SURGERY Bilateral     Prior to Admission medications   Medication Sig Start Date End Date Taking? Authorizing Provider  docusate sodium (COLACE) 100 MG capsule Take 1 capsule (100 mg total) by mouth 2 (two) times daily. 08/31/15   Alanda Slim Defrancesco, MD  ferrous sulfate 325 (65 FE) MG tablet Take 1 tablet (325 mg total) by mouth 2 (two) times daily with a meal. 08/31/15   Alanda Slim Defrancesco, MD  ibuprofen (ADVIL,MOTRIN) 800 MG tablet Take 1 tablet (800 mg total) by mouth 3 (three) times daily. 08/31/15   Alanda Slim Defrancesco, MD  Iron-FA-B Cmp-C-Biot-Probiotic (FUSION PLUS) CAPS Take 1 capsule by mouth daily. Patient not taking: Reported on 07/23/2015 06/21/15   Melody N Shambley, CNM  oxyCODONE (OXY IR/ROXICODONE) 5 MG  immediate release tablet Take 1 tablet (5 mg total) by mouth every 4 (four) hours as needed (pain scale 4-7). 08/31/15   Alanda Slim Defrancesco, MD  Prenatal Vit-Fe Fumarate-FA (MULTIVITAMIN-PRENATAL) 27-0.8 MG TABS tablet Take 1 tablet by mouth daily at 12 noon.    Historical Provider, MD  PROCTOSOL HC 2.5 % rectal cream Reported on 07/23/2015 05/18/15   Historical Provider, MD    Allergies Review of patient's allergies indicates no known allergies.  Family History  Problem Relation Age of Onset  . Diabetes Mother   . Anesthesia problems Neg Hx     Social History Social History  Substance Use Topics  . Smoking status: Never Smoker  . Smokeless tobacco: Never Used  . Alcohol use No    Review of Systems  Constitutional: Negative for fever. Eyes: Negative for visual changes. ENT: Positive for sore throat. Cardiovascular: Negative for chest pain. Respiratory: Negative for shortness of breath. Gastrointestinal: Negative for abdominal pain, vomiting and diarrhea. Skin: Negative for rash. ____________________________________________  PHYSICAL EXAM:  VITAL SIGNS: ED Triage Vitals  Enc Vitals Group     BP 10/08/15 1706 116/70     Pulse Rate 10/08/15 1706 90     Resp 10/08/15 1706 18     Temp 10/08/15 1706 98.7 F (37.1 C)     Temp Source 10/08/15 1706 Oral     SpO2 10/08/15 1706 100 %     Weight 10/08/15 1707 214 lb (97.1 kg)     Height 10/08/15 1707 5\' 5"  (1.651 m)  Head Circumference --      Peak Flow --      Pain Score 10/08/15 1707 2     Pain Loc --      Pain Edu? --      Excl. in [place]? --     Constitutional: Alert and oriented. Well appearing and in no distress. Head: Normocephalic and atraumatic.      Eyes: Conjunctivae are normal. PERRL. Normal extraocular movements      Ears: Canals clear. TMs intact bilaterally.   Nose: No congestion/rhinorrhea.   Mouth/Throat: Mucous membranes are moist. Uvula is midline and tonsils are flat without erythema, edema, or  exudate.   Neck: Supple. No thyromegaly. Hematological/Lymphatic/Immunological: No cervical lymphadenopathy. Cardiovascular: Normal rate, regular rhythm.  Respiratory: Normal respiratory effort. No wheezes/rales/rhonchi. ____________________________________________   LABS (pertinent positives/negatives) Labs Reviewed  POCT RAPID STREP A  ____________________________________________  INITIAL IMPRESSION / ASSESSMENT AND PLAN / ED COURSE  Patient with symptoms consistent with a likely viral URI versus mild allergic rhinitis. Take OTC meds as needed for symptom relief. Follow-up with the primary MD for continued symptoms.   Clinical Course   ____________________________________________  FINAL CLINICAL IMPRESSION(S) / ED DIAGNOSES  Final diagnoses:  Sore throat     Melvenia Needles, PA-C 10/10/15 1535    Merlyn Lot, MD 10/11/15 1056

## 2015-10-11 ENCOUNTER — Ambulatory Visit (INDEPENDENT_AMBULATORY_CARE_PROVIDER_SITE_OTHER): Payer: Medicaid Other | Admitting: Obstetrics and Gynecology

## 2015-10-11 ENCOUNTER — Encounter: Payer: Self-pay | Admitting: Obstetrics and Gynecology

## 2015-10-11 DIAGNOSIS — E6609 Other obesity due to excess calories: Secondary | ICD-10-CM

## 2015-10-11 DIAGNOSIS — O99345 Other mental disorders complicating the puerperium: Secondary | ICD-10-CM

## 2015-10-11 DIAGNOSIS — F53 Postpartum depression: Secondary | ICD-10-CM

## 2015-10-11 MED ORDER — NORETHINDRONE 0.35 MG PO TABS: 1.0000 | ORAL_TABLET | Freq: Every day | ORAL | 11 refills | Status: DC

## 2015-10-11 MED ORDER — ESCITALOPRAM OXALATE 10 MG PO TABS: 10.0000 mg | ORAL_TABLET | Freq: Every day | ORAL | 6 refills | Status: DC

## 2015-10-11 NOTE — Patient Instructions (Addendum)
Place postpartum visit patient instructions here.   Postpartum Depression and Baby Blues The postpartum period begins right after the birth of a baby. During this time, there is often a great amount of joy and excitement. It is also a time of many changes in the life of the parents. Regardless of how many times a mother gives birth, each child brings new challenges and dynamics to the family. It is not unusual to have feelings of excitement along with confusing shifts in moods, emotions, and thoughts. All mothers are at risk of developing postpartum depression or the "baby blues." These mood changes can occur right after giving birth, or they may occur many months after giving birth. The baby blues or postpartum depression can be mild or severe. Additionally, postpartum depression can go away rather quickly, or it can be a long-term condition.  CAUSES Raised hormone levels and the rapid drop in those levels are thought to be a main cause of postpartum depression and the baby blues. A number of hormones change during and after pregnancy. Estrogen and progesterone usually decrease right after the delivery of your baby. The levels of thyroid hormone and various cortisol steroids also rapidly drop. Other factors that play a role in these mood changes include major life events and genetics.  RISK FACTORS If you have any of the following risks for the baby blues or postpartum depression, know what symptoms to watch out for during the postpartum period. Risk factors that may increase the likelihood of getting the baby blues or postpartum depression include:  Having a personal or family history of depression.   Having depression while being pregnant.   Having premenstrual mood issues or mood issues related to oral contraceptives.  Having a lot of life stress.   Having marital conflict.   Lacking a social support network.   Having a baby with special needs.   Having health problems, such as  diabetes.  SIGNS AND SYMPTOMS Symptoms of baby blues include:  Brief changes in mood, such as going from extreme happiness to sadness.  Decreased concentration.   Difficulty sleeping.   Crying spells, tearfulness.   Irritability.   Anxiety.  Symptoms of postpartum depression typically begin within the first month after giving birth. These symptoms include:  Difficulty sleeping or excessive sleepiness.   Marked weight loss.   Agitation.   Feelings of worthlessness.   Lack of interest in activity or food.  Postpartum psychosis is a very serious condition and can be dangerous. Fortunately, it is rare. Displaying any of the following symptoms is cause for immediate medical attention. Symptoms of postpartum psychosis include:   Hallucinations and delusions.   Bizarre or disorganized behavior.   Confusion or disorientation.  DIAGNOSIS  A diagnosis is made by an evaluation of your symptoms. There are no medical or lab tests that lead to a diagnosis, but there are various questionnaires that a health care provider may use to identify those with the baby blues, postpartum depression, or psychosis. Often, a screening tool called the [place] Postnatal Depression Scale is used to diagnose depression in the postpartum period.  TREATMENT The baby blues usually goes away on its own in 1-2 weeks. Social support is often all that is needed. You will be encouraged to get adequate sleep and rest. Occasionally, you may be given medicines to help you sleep.  Postpartum depression requires treatment because it can last several months or longer if it is not treated. Treatment may include individual or group therapy, medicine, or  both to address any social, physiological, and psychological factors that may play a role in the depression. Regular exercise, a healthy diet, rest, and social support may also be strongly recommended.  Postpartum psychosis is more serious and needs treatment  right away. Hospitalization is often needed. HOME CARE INSTRUCTIONS  Get as much rest as you can. Nap when the baby sleeps.   Exercise regularly. Some women find yoga and walking to be beneficial.   Eat a balanced and nourishing diet.   Do little things that you enjoy. Have a cup of tea, take a bubble bath, read your favorite magazine, or listen to your favorite music.  Avoid alcohol.   Ask for help with household chores, cooking, grocery shopping, or running errands as needed. Do not try to do everything.   Talk to people close to you about how you are feeling. Get support from your partner, family members, friends, or other new moms.  Try to stay positive in how you think. Think about the things you are grateful for.   Do not spend a lot of time alone.   Only take over-the-counter or prescription medicine as directed by your health care provider.  Keep all your postpartum appointments.   Let your health care provider know if you have any concerns.  SEEK MEDICAL CARE IF: You are having a reaction to or problems with your medicine. SEEK IMMEDIATE MEDICAL CARE IF:  You have suicidal feelings.   You think you may harm the baby or someone else. MAKE SURE YOU:  Understand these instructions.  Will watch your condition.  Will get help right away if you are not doing well or get worse.   This information is not intended to replace advice given to you by your health care provider. Make sure you discuss any questions you have with your health care provider.   Document Released: 09/28/2003 Document Revised: 12/29/2012 Document Reviewed: 10/05/2012 Elsevier Interactive Patient Education [employer].

## 2015-10-11 NOTE — Progress Notes (Signed)
  Subjective:     Krystal Harrison is a 30 y.o. female who presents for a postpartum visit. She is 6 weeks postpartum following a spontaneous vaginal delivery. I have fully reviewed the prenatal and intrapartum course. The delivery was at [redacted]w[redacted]d gestational weeks. Outcome: spontaneous vaginal delivery. Anesthesia: none. Postpartum course has been depression. Score of 13 calculated on PPD scale. Baby's course has been uncomplicated. Baby is feeding by breast. Bleeding no bleeding. Bowel function is normal. Bladder function is normal. Patient is not sexually active. Contraception method is abstinence. Postpartum depression screening: positive.  The following portions of the patient's history were reviewed and updated as appropriate: allergies, current medications, past family history, past medical history, past social history, past surgical history and problem list.  Review of Systems Pertinent items noted in HPI and remainder of comprehensive ROS otherwise negative.   Objective:    BP 115/84   Pulse 72   Ht 5\' 5"  (1.651 m)   Wt 211 lb 4.8 oz (95.8 kg)   Breastfeeding? Yes   BMI 35.16 kg/m   General:  alert, cooperative, appears stated age and mild distress   Breasts:  inspection negative, no nipple discharge or bleeding, no masses or nodularity palpable  Lungs: clear to auscultation bilaterally  Heart:  regular rate and rhythm, S1, S2 normal, no murmur, click, rub or gallop  Abdomen: soft, non-tender; bowel sounds normal; no masses,  no organomegaly   Vulva:  normal  Vagina: normal vagina, no discharge, exudate, lesion, or erythema  Cervix:  multiparous appearance  Corpus: normal size, contour, position, consistency, mobility, non-tender  Adnexa:  normal adnexa and no mass, fullness, tenderness  Rectal Exam: Not performed.        Assessment:     6 weeks postpartum exam. Pap smear not done at today's visit. PPD  Plan:    1. Contraception: oral progesterone-only contraceptive 2. PPD-  started on Lexapro 3. Follow up in: 2 months or as needed.

## 2015-10-12 ENCOUNTER — Telehealth: Payer: Self-pay | Admitting: *Deleted

## 2015-10-12 ENCOUNTER — Other Ambulatory Visit: Payer: Self-pay | Admitting: Obstetrics and Gynecology

## 2015-10-12 DIAGNOSIS — E559 Vitamin D deficiency, unspecified: Secondary | ICD-10-CM

## 2015-10-12 LAB — CBC
Hematocrit: 38.9 % (ref 34.0–46.6)
Hemoglobin: 12.5 g/dL (ref 11.1–15.9)
MCH: 24.6 pg — ABNORMAL LOW (ref 26.6–33.0)
MCHC: 32.1 g/dL (ref 31.5–35.7)
MCV: 76 fL — ABNORMAL LOW (ref 79–97)
Platelets: 265 10*3/uL (ref 150–379)
RBC: 5.09 x10E6/uL (ref 3.77–5.28)
RDW: 20.1 % — ABNORMAL HIGH (ref 12.3–15.4)
WBC: 10.6 10*3/uL (ref 3.4–10.8)

## 2015-10-12 LAB — VITAMIN D 25 HYDROXY (VIT D DEFICIENCY, FRACTURES): Vit D, 25-Hydroxy: 26.5 ng/mL — ABNORMAL LOW (ref 30.0–100.0)

## 2015-10-12 LAB — IRON: Iron: 47 ug/dL (ref 27–159)

## 2015-10-12 MED ORDER — VITAMIN D (ERGOCALCIFEROL) 1.25 MG (50000 UNIT) PO CAPS: 50000.0000 [IU] | ORAL_CAPSULE | ORAL | 2 refills | Status: DC

## 2015-10-12 NOTE — Telephone Encounter (Signed)
-----   Message from Joylene Igo, [place] sent at 10/12/2015 12:03 PM EDT ----- Please let her know anemia is resolved, but is vit D def., I sent in rx for weekly supplement, and please mail info on vit D def.

## 2015-10-12 NOTE — Telephone Encounter (Signed)
Mailed pt all info about labs 

## 2015-12-07 ENCOUNTER — Encounter: Payer: Medicaid Other | Admitting: Obstetrics and Gynecology

## 2016-08-16 ENCOUNTER — Ambulatory Visit (INDEPENDENT_AMBULATORY_CARE_PROVIDER_SITE_OTHER): Payer: Medicaid Other | Admitting: Obstetrics and Gynecology

## 2016-08-16 ENCOUNTER — Encounter: Payer: Self-pay | Admitting: Obstetrics and Gynecology

## 2016-08-16 VITALS — BP 118/82 | HR 84 | Ht 65.0 in | Wt 195.3 lb

## 2016-08-16 DIAGNOSIS — Z308 Encounter for other contraceptive management: Secondary | ICD-10-CM | POA: Diagnosis not present

## 2016-08-16 DIAGNOSIS — F419 Anxiety disorder, unspecified: Secondary | ICD-10-CM

## 2016-08-16 DIAGNOSIS — E559 Vitamin D deficiency, unspecified: Secondary | ICD-10-CM | POA: Diagnosis not present

## 2016-08-16 MED ORDER — ESCITALOPRAM OXALATE 10 MG PO TABS: 10.0000 mg | ORAL_TABLET | Freq: Every day | ORAL | 6 refills | Status: DC

## 2016-08-16 MED ORDER — NORETHINDRONE 0.35 MG PO TABS: 1.0000 | ORAL_TABLET | Freq: Every day | ORAL | 11 refills | Status: DC

## 2016-08-16 NOTE — Patient Instructions (Signed)
Laparoscopic Tubal Ligation Laparoscopic tubal ligation is a procedure to close the fallopian tubes. This is done so that you cannot get pregnant. When the fallopian tubes are closed, the eggs that your ovaries release cannot enter the uterus, and sperm cannot reach the released eggs. A laparoscopic tubal ligation is sometimes called "getting your tubes tied." You should not have this procedure if you want to get pregnant someday or if you are unsure about having more children. Tell a health care provider about:  Any allergies you have.  All medicines you are taking, including vitamins, herbs, eye drops, creams, and over-the-counter medicines.  Any problems you or family members have had with anesthetic medicines.  Any blood disorders you have.  Any surgeries you have had.  Any medical conditions you have.  Whether you are pregnant or may be pregnant.  Any past pregnancies. What are the risks? Generally, this is a safe procedure. However, problems may occur, including:  Infection.  Bleeding.  Injury to surrounding organs.  Side effects from anesthetics.  Failure of the procedure.  This procedure can increase your risk of a kind of pregnancy in which a fertilized egg attaches to the outside of the uterus (ectopic pregnancy). What happens before the procedure?  Ask your health care provider about: ? Changing or stopping your regular medicines. This is especially important if you are taking diabetes medicines or blood thinners. ? Taking medicines such as aspirin and ibuprofen. These medicines can thin your blood. Do not take these medicines before your procedure if your health care provider instructs you not to.  Follow instructions from your health care provider about eating and drinking restrictions.  Plan to have someone take you home after the procedure.  If you go home right after the procedure, plan to have someone with you for 24 hours. What happens during the  procedure?  You will be given one or more of the following: ? A medicine to help you relax (sedative). ? A medicine to numb the area (local anesthetic). ? A medicine to make you fall asleep (general anesthetic). ? A medicine that is injected into an area of your body to numb everything below the injection site (regional anesthetic).  An IV tube will be inserted into one of your veins. It will be used to give you medicines and fluids during the procedure.  Your bladder may be emptied with a small tube (catheter).  If you have been given a general anesthetic, a tube will be put down your throat to help you breathe.  Two small cuts (incisions) will be made in your lower abdomen and near your belly button.  Your abdomen will be inflated with a gas. This will let the surgeon see better and will give the surgeon room to work.  A thin, lighted tube (laparoscope) with a camera attached will be inserted into your abdomen through one of the incisions. Small instruments will be inserted through the other incision.  The fallopian tubes will be tied off, burned (cauterized), or blocked with a clip, ring, or clamp. A small portion in the center of each fallopian tube may be removed.  The gas will be released from the abdomen.  The incisions will be closed with stitches (sutures).  A bandage (dressing) will be placed over the incisions. The procedure may vary among health care providers and hospitals. What happens after the procedure?  Your blood pressure, heart rate, breathing rate, and blood oxygen level will be monitored often until the medicines you   were given have worn off.  You will be given medicine to help with pain, nausea, and vomiting as needed. This information is not intended to replace advice given to you by your health care provider. Make sure you discuss any questions you have with your health care provider. Document Released: 04/01/2000 Document Revised: 06/01/2015 Document  Reviewed: 12/04/2014 Elsevier Interactive Patient Education  2018 Elsevier Inc.  

## 2016-08-16 NOTE — Progress Notes (Signed)
Subjective:     Patient ID: Krystal Harrison, female   DOB: 1985-02-05, 31 y.o.   MRN: [id number]  HPI Here to discuss tubal ligation or other forms of BC. Currently not using anything and is breast feeding 3-5 times in 24 hours. Monthly menses that are normal. Pretty sure she wants sterilization, or pills to cover in the mean time. Also request weight loss medication.  States she is having episodes of anxiety due to increased stress at home. She and boyfriend have been on and off, and she is staying home caring for children, not working and finances are a problem. States she feels depressed and tearful. Describes panic attacks as chest pressure with palpitations that last for a few minutes to an hours. Only occurs when feels overwhelmed or arguing with him.    Review of Systems Negative except stated above    Objective:   Physical Exam A&Ox4 Well groomed female, tearful when discussing home life stressors, appropriately so.intact thought processes. Blood pressure 118/82, pulse 84, height 5\' 5"  (1.651 m), weight 195 lb 4.8 oz (88.6 kg), last menstrual period 07/30/2016, currently breastfeeding. PE not indicated, labs drawn    Assessment:     Contraception counseling Anxiety with mild depression Obesity lactating    Plan:     Counseled at length regarding BTL, OCPs and all forms of BC. Desires OCP until later date when she is able to proceed with BTL. Started on lexapro for anxiety. Will return in 8 weeks for follow up. Explained she cannot take weight loss medications while breast feeding. Labs obtained-will follow up accordingly.  >50% of 20 minute spent in counseling.  Melody [place], CNM

## 2016-10-04 ENCOUNTER — Encounter: Payer: Medicaid Other | Admitting: Obstetrics and Gynecology

## 2016-11-04 ENCOUNTER — Telehealth: Payer: Self-pay | Admitting: Obstetrics and Gynecology

## 2016-11-04 NOTE — Telephone Encounter (Signed)
Patient with C/O low back pain, fever and chills that comes and goes and bleeding with intercourse that stops right after but is heavy during the intercourse. This has been going on for the past 5 months.  Please call

## 2016-11-06 ENCOUNTER — Ambulatory Visit (INDEPENDENT_AMBULATORY_CARE_PROVIDER_SITE_OTHER): Payer: Medicaid Other | Admitting: Obstetrics and Gynecology

## 2016-11-06 ENCOUNTER — Other Ambulatory Visit: Payer: Self-pay | Admitting: Obstetrics and Gynecology

## 2016-11-06 ENCOUNTER — Encounter: Payer: Self-pay | Admitting: Obstetrics and Gynecology

## 2016-11-06 VITALS — BP 110/76 | HR 85 | Ht 65.0 in | Wt 183.7 lb

## 2016-11-06 DIAGNOSIS — Z113 Encounter for screening for infections with a predominantly sexual mode of transmission: Secondary | ICD-10-CM | POA: Diagnosis not present

## 2016-11-06 DIAGNOSIS — N930 Postcoital and contact bleeding: Secondary | ICD-10-CM

## 2016-11-06 NOTE — Progress Notes (Signed)
Subjective:     Patient ID: Krystal Harrison, female   DOB: 1985-12-12, 31 y.o.   MRN: [id number]  HPI Here for STI testing as she found out boyfriend was cheating on her. Reports last intercourse was 2 weeks ago. Also concerned about postcoital bleeding. Denies pain with sex, but reports heavy gush of blood every time for last 4 months. Regular menses monthly not related. States slight increase in discharge with odor for the last week. Not using BC at this time.  Review of Systems Negative except stated above in HPI    Objective:   Physical Exam A&Ox4 Well groomed female in no distress Blood pressure 110/76, pulse 85, height 5\' 5"  (1.651 m), weight 183 lb 11.2 oz (83.3 kg), last menstrual period 10/26/2016, currently breastfeeding. Pelvic exam: normal external genitalia, vulva, vagina, cervix, uterus and adnexa, CERVIX: normal appearing cervix without discharge or lesions, friable internal os, cervical discharge present - bloody, WET MOUNT done - results: negative for pathogens, normal epithelial cells, DNA probe for chlamydia and GC obtained.    Assessment:     postcotial bleeding STI screening.    Plan:     Labs obtained- will follow up accordingly Discussed common causes of bleeding during and after sex, including STIs, HPV effect, nabothian cysts, vaginal trauma.  RTC as needed.  Tabari Volkert,CNM

## 2016-11-07 LAB — BETA HCG QUANT (REF LAB): hCG Quant: <1 m[IU]/mL

## 2016-11-07 LAB — HEPATITIS PANEL, ACUTE
Hep A IgM: NEGATIVE
Hep B C IgM: NEGATIVE
Hep C Virus Ab: <0.1 s/co ratio (ref 0.0–0.9)
Hepatitis B Surface Ag: NEGATIVE

## 2016-11-07 LAB — HSV 1 AND 2 IGM ABS, INDIRECT
HSV 1 IgM: <1:10 {titer}
HSV 2 IgM: <1:10 {titer}

## 2016-11-07 LAB — RPR: RPR Ser Ql: NONREACTIVE

## 2016-11-07 LAB — HIV ANTIBODY (ROUTINE TESTING W REFLEX): HIV Screen 4th Generation wRfx: NONREACTIVE

## 2016-11-08 ENCOUNTER — Telehealth: Payer: Self-pay | Admitting: Obstetrics and Gynecology

## 2016-11-08 ENCOUNTER — Emergency Department
Admission: EM | Admit: 2016-11-08 | Discharge: 2016-11-08 | Disposition: A | Discharge status: Home / Self Care | Payer: Medicaid Other | Attending: Emergency Medicine | Admitting: Emergency Medicine

## 2016-11-08 ENCOUNTER — Encounter: Payer: Self-pay | Admitting: Emergency Medicine

## 2016-11-08 DIAGNOSIS — Z79899 Other long term (current) drug therapy: Secondary | ICD-10-CM | POA: Insufficient documentation

## 2016-11-08 DIAGNOSIS — A609 Anogenital herpesviral infection, unspecified: Secondary | ICD-10-CM | POA: Insufficient documentation

## 2016-11-08 DIAGNOSIS — R21 Rash and other nonspecific skin eruption: Secondary | ICD-10-CM | POA: Diagnosis present

## 2016-11-08 LAB — CHLAMYDIA/NGC RT PCR (ARMC ONLY)
Chlamydia Tr: NOT DETECTED
N gonorrhoeae: NOT DETECTED

## 2016-11-08 LAB — WET PREP, GENITAL
Clue Cells Wet Prep HPF POC: NONE SEEN
Sperm: NONE SEEN
Trich, Wet Prep: NONE SEEN
Yeast Wet Prep HPF POC: NONE SEEN

## 2016-11-08 LAB — CYTOLOGY - PAP

## 2016-11-08 MED ORDER — ACYCLOVIR 800 MG PO TABS: 800.0000 mg | ORAL_TABLET | Freq: Two times a day (BID) | ORAL | 1 refills | Status: AC

## 2016-11-08 NOTE — ED Provider Notes (Signed)
[facility] Emergency Department Provider Note ____________________________________________  Time seen: 1534  I have reviewed the triage vital signs and the nursing notes.  HISTORY  Chief Complaint  Rash  HPI Krystal Harrison is a 31 y.o. female resents to the ED for evaluation of a rash to the perineal area that she had a for the last 2-3 days.  Patient describes the area with very fine blisters causing some mild discomfort.  She denies any significant vaginal discharge, or abnormal bleeding.  She denies any fevers, chills, or sweats.  She does give a history of HSV, with her last outbreak more than 2 years ago.  He does have a concern for other STDs transferred from her current boyfriend.  Past Medical History:  Diagnosis Date  . Herpes genitalia    last outbreak 1-2 yrs ago    Patient Active Problem List   Diagnosis Date Noted  . Vitamin D deficiency 10/12/2015  . Increased BMI 07/19/2015  . 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 Rubella non-immune status, antepartum 02/22/2015  . Chronic constipation 01/31/2015    Past Surgical History:  Procedure Laterality Date  . EYE SURGERY    . EYE SURGERY Bilateral     Prior to Admission medications   Medication Sig Start Date End Date Taking? Authorizing Provider  acyclovir (ZOVIRAX) 800 MG tablet Take 1 tablet (800 mg total) by mouth 2 (two) times daily. 11/08/16 11/13/16  Brailey Buescher, Dannielle Karvonen, PA-C  escitalopram (LEXAPRO) 10 MG tablet Take 1 tablet (10 mg total) by mouth daily. Patient not taking: Reported on 11/06/2016 08/16/16   Shambley, Melody N, CNM  norethindrone (MICRONOR,CAMILA,ERRIN) 0.35 MG tablet Take 1 tablet (0.35 mg total) by mouth daily. Patient not taking: Reported on 11/06/2016 08/16/16   Joylene Igo, CNM    Allergies Patient has no known allergies.  Family History  Problem Relation Age of Onset  . Diabetes Mother   . Anesthesia problems Neg Hx     Social History Social History   Substance Use Topics  . Smoking status: Never Smoker  . Smokeless tobacco: Never Used  . Alcohol use No    Review of Systems  Constitutional: Negative for fever. Respiratory: Negative for shortness of breath. Gastrointestinal: Negative for abdominal pain, vomiting and diarrhea. Genitourinary: Negative for dysuria. Vulvar lesions as above Musculoskeletal: Negative for back pain. Skin: Negative for rash. Neurological: Negative for headaches, focal weakness or numbness. ____________________________________________  PHYSICAL EXAM:  VITAL SIGNS: ED Triage Vitals  Enc Vitals Group     BP 11/08/16 1432 114/66     Pulse Rate 11/08/16 1432 90     Resp 11/08/16 1432 18     Temp 11/08/16 1432 98.3 F (36.8 C)     Temp Source 11/08/16 1432 Oral     SpO2 --      Weight 11/08/16 1432 179 lb (81.2 kg)     Height 11/08/16 1432 5\' 5"  (1.651 m)     Head Circumference --      Peak Flow --      Pain Score 11/08/16 1438 4     Pain Loc --      Pain Edu? --      Excl. in [place]? --     Constitutional: Alert and oriented. Well appearing and in no distress. Head: Normocephalic and atraumatic. Cardiovascular: Normal rate, regular rhythm.  GU: Normal external genitalia.  Patient with multiple small pustules, grouped in a linear distribution between the introitus and the rectum. Friable cervix without  active bleeding.  Musculoskeletal: Nontender with normal range of motion in all extremities.  Neurologic:  Normal gait without ataxia. Normal speech and language. No gross focal neurologic deficits are appreciated. Skin:  Skin is warm, dry and intact. No rash noted. ____________________________________________   LABS (pertinent positives/negatives) Labs Reviewed  WET PREP, GENITAL - Abnormal; Notable for the following:       Result Value   WBC, Wet Prep HPF POC FEW (*)    All other components within normal limits  CHLAMYDIA/NGC RT PCR ([facility] ONLY)   ____________________________________________  INITIAL IMPRESSION / ASSESSMENT AND PLAN / ED COURSE  Patient with ED evaluation of a vesicular rash to the perineum.  Clinical evaluation confirms an HSV outbreak.  Patient is treated with acyclovir given instructions to avoid intimate contact until the rash is resolved.  Follow-up with the primary care provider for ongoing symptom management. ____________________________________________  FINAL CLINICAL IMPRESSION(S) / ED DIAGNOSES  Final diagnoses:  HSV (herpes simplex virus) anogenital infection      Nivek Powley, Dannielle Karvonen, PA-C 11/08/16 2127    Schuyler Amor, MD 11/09/16 [phone]

## 2016-11-08 NOTE — Telephone Encounter (Signed)
Please call with test results - she has some pain and discomfort with bumps between her vagina and her anus

## 2016-11-08 NOTE — Discharge Instructions (Signed)
Your symptoms are consistent with a HSV outbreak. Take the antiviral medication and follow-up with your provider, as needed. Other tests are pending, feel free to call back in 2 hours to confirm results.

## 2016-11-08 NOTE — ED Triage Notes (Signed)
FIRST NURSE NOTE-would like to be seen for rash. Here with son as pt as well.  Ambulatory. No distress.

## 2016-11-08 NOTE — ED Triage Notes (Signed)
Pt states that she has had a rash in her vaginal area x 2-3 days. Pt states that rash is causing mild discomfort. Pt denies fever or chills.

## 2016-11-12 ENCOUNTER — Telehealth: Payer: Self-pay | Admitting: Obstetrics and Gynecology

## 2016-11-12 NOTE — Telephone Encounter (Signed)
Please call patient with results.

## 2016-11-12 NOTE — Telephone Encounter (Signed)
Patient is calling again for results - she states that she has called for 2 days now and would like someone to call her

## 2016-11-13 ENCOUNTER — Telehealth: Payer: Self-pay | Admitting: *Deleted

## 2016-11-13 NOTE — Telephone Encounter (Signed)
Pt was in the other day was tested for STD, states she is having bleeding, heavy bleeding during intercourse, states it happens every time x 5 months

## 2016-11-13 NOTE — Telephone Encounter (Signed)
Tried calling pt mailbox is full

## 2016-11-15 ENCOUNTER — Encounter: Payer: Self-pay | Admitting: Obstetrics and Gynecology

## 2016-11-25 NOTE — Telephone Encounter (Signed)
Called pt appt made

## 2016-11-25 NOTE — Telephone Encounter (Signed)
With all test being negative, I still feel like it is cervical. I can do a pap for HPV specifically or a colpo to take a closer look. But most of the time it is transient. Please have her come in for colpo if she is interested and refrain from sex for 3 days before. Otherwise can just wait and see if it resolves on its on.

## 2016-12-11 ENCOUNTER — Ambulatory Visit (INDEPENDENT_AMBULATORY_CARE_PROVIDER_SITE_OTHER): Payer: Medicaid Other | Admitting: Obstetrics and Gynecology

## 2016-12-11 ENCOUNTER — Encounter: Payer: Self-pay | Admitting: Obstetrics and Gynecology

## 2016-12-11 ENCOUNTER — Other Ambulatory Visit: Payer: Self-pay | Admitting: Obstetrics and Gynecology

## 2016-12-11 VITALS — BP 104/66 | HR 85 | Wt 183.7 lb

## 2016-12-11 DIAGNOSIS — Z3040 Encounter for surveillance of contraceptives, unspecified: Secondary | ICD-10-CM

## 2016-12-11 DIAGNOSIS — N930 Postcoital and contact bleeding: Secondary | ICD-10-CM

## 2016-12-11 NOTE — Progress Notes (Signed)
Subjective:     Patient ID: Krystal Harrison, female   DOB: 1985/09/22, 31 y.o.   MRN: [id number]  HPI Still having heavy bleeding after sex and some during sex. Denies pain or bleeding at other times. See previous notes.   Also desires restarting BC as her menses are very heavy for 4-7 days, large clots noted.  Had both IUDs in past along with BCP, Nuvaring and depo injections. Unsure which form see desires.  Review of Systems Negative except stated above in HPI.    Objective:   Physical Exam A&Ox4 Well groomed female in no distress Vitals:   12/11/16 1122  Weight: 183 lb 11.2 oz (83.3 kg)  Body mass index is 30.57 kg/m.  UPT- Pelvic exam: normal external genitalia, vulva, vagina, cervix, uterus and adnexa.   colposcopy done with small blood vessel noted at 8 o'clock. Bled easily with Qtip touch- cauterized with silver nitrate. ECC obtained. Assessment:     Postcoital bleeding Restart Nuvaring    Plan:     Will follow up if ECC abnormal of bleeding returns. Nothing placed vaginally for 1 weeks. Notified to call if bleeding heavy. To place Nuvaring on 4th day of next menses. Will let me know if happy with it and desires to continue on it.  Melody LaCoste.CNM

## 2017-02-06 IMAGING — US US OB COMP LESS 14 WK
1 series · 14 of 28 positions shown · non-contrast
Comparison: 10/17/2014

CLINICAL DATA: Vaginal bleeding for 1 day. Early pregnancy. Beta
HCG 34 today (135 on 10/31/2014).

EXAM:
OBSTETRIC <14 WK US AND TRANSVAGINAL OB US
TECHNIQUE: Both transabdominal and transvaginal ultrasound examinations were
performed for complete evaluation of the gestation as well as the
maternal uterus, adnexal regions, and pelvic cul-de-sac.
Transvaginal technique was performed to assess early pregnancy.

[Series 1: us ob comp less 14 wk · 0.24mm/px · 14 of 97 slices shown]
[im 4/97]
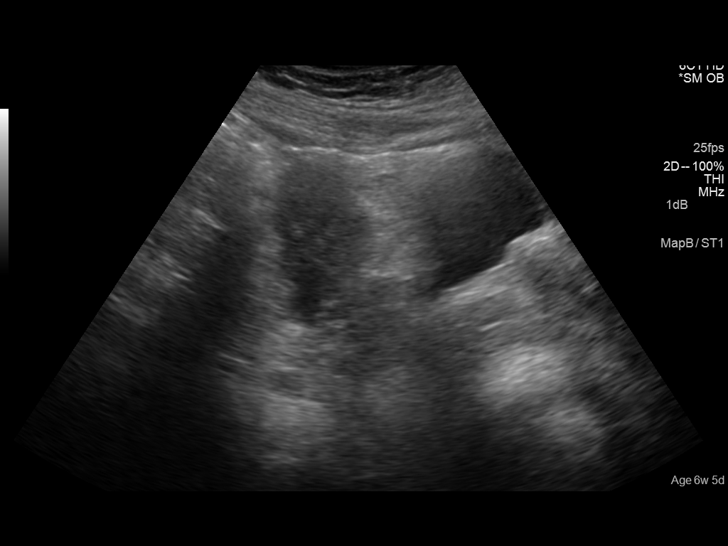
[im 11/97]
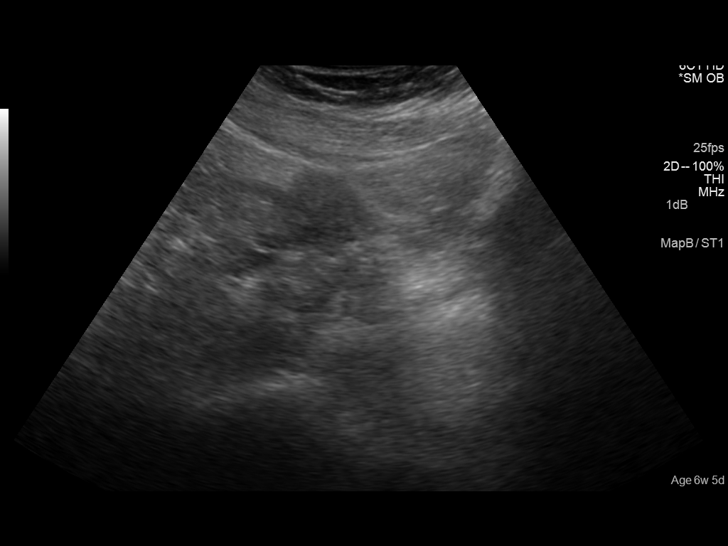
[im 18/97]
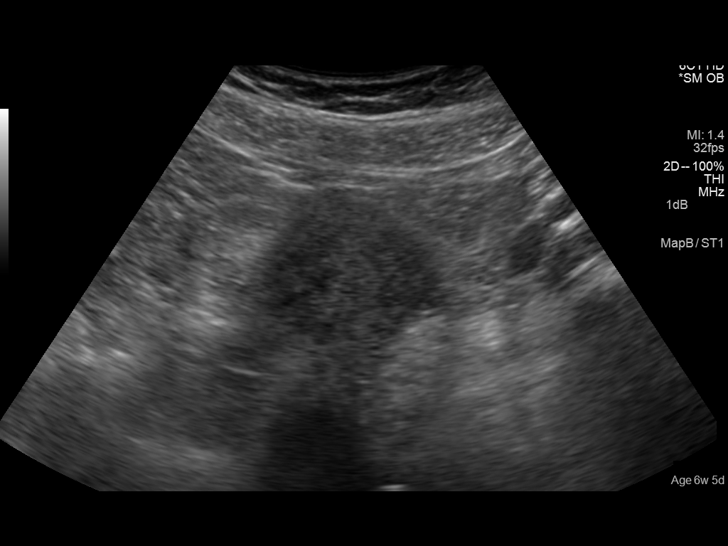
[im 25/97]
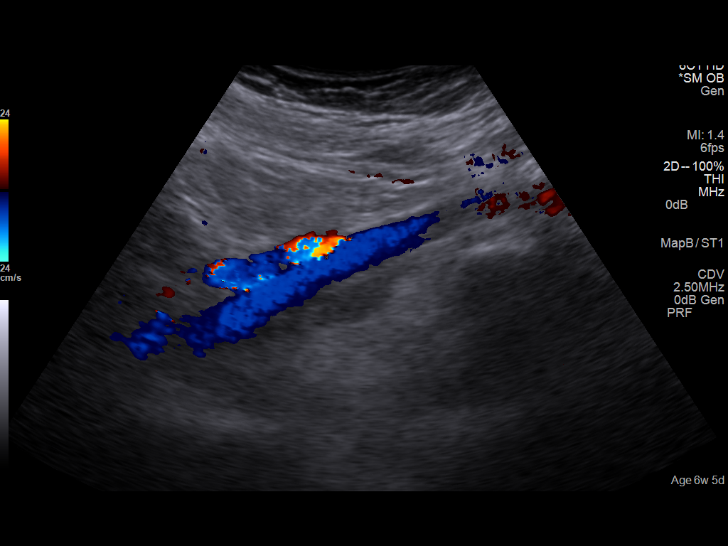
[im 33/97]
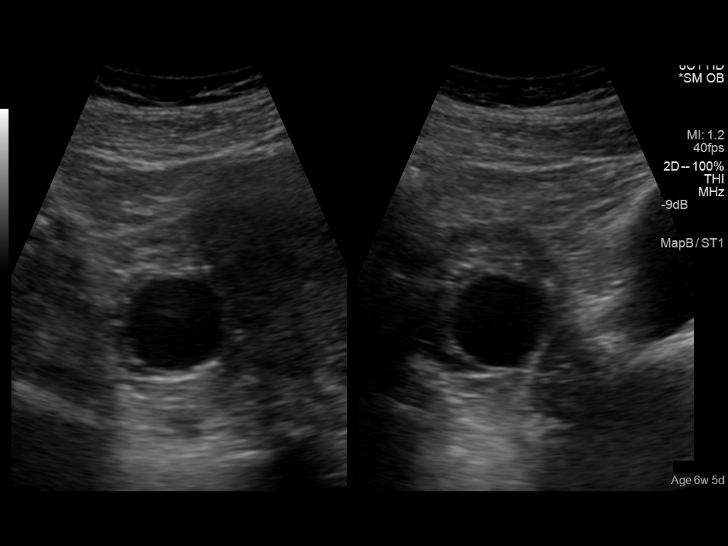
[im 40/97]
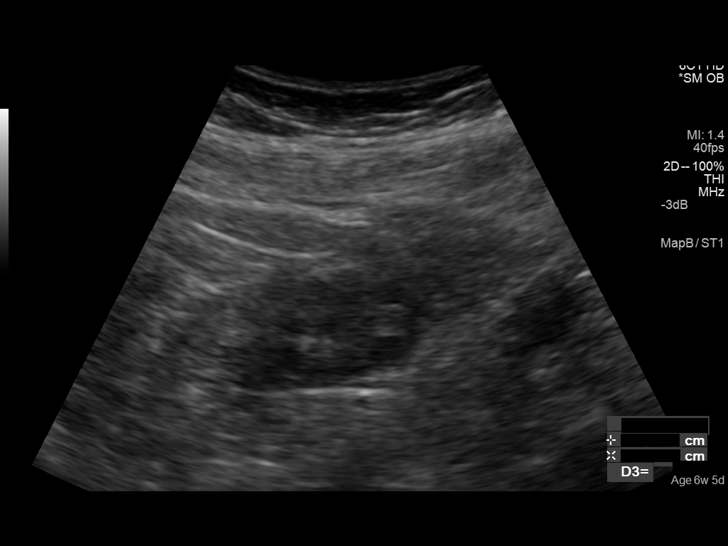
[im 47/97]
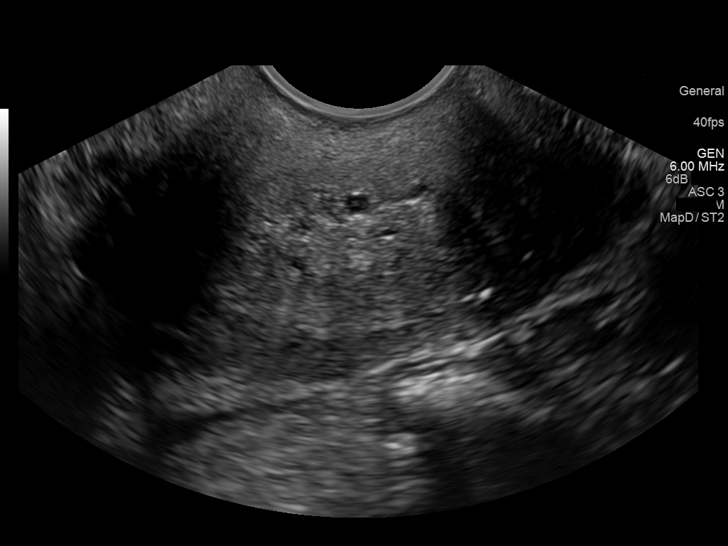
[im 54/97]
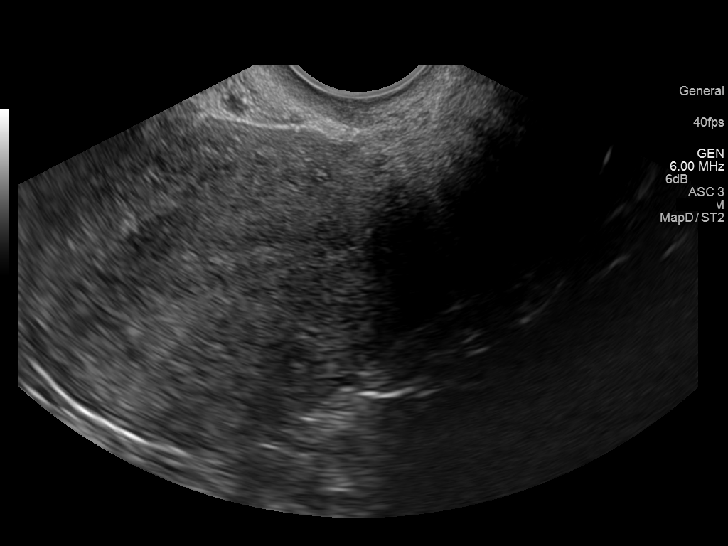
[im 61/97]
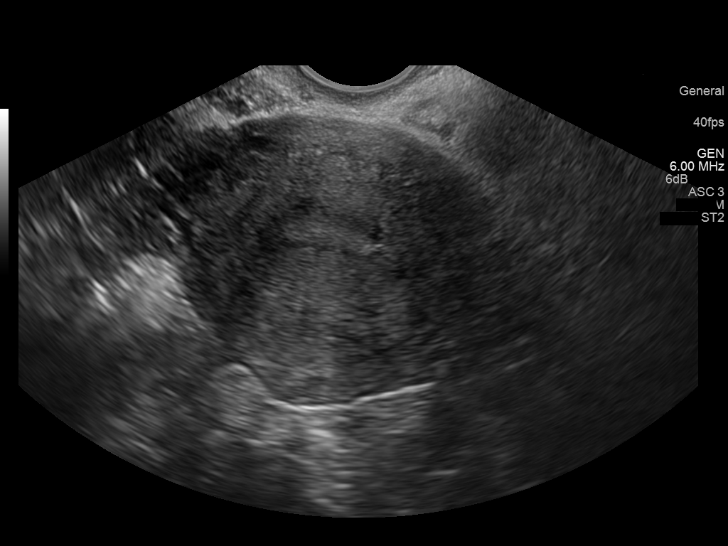
[im 68/97]
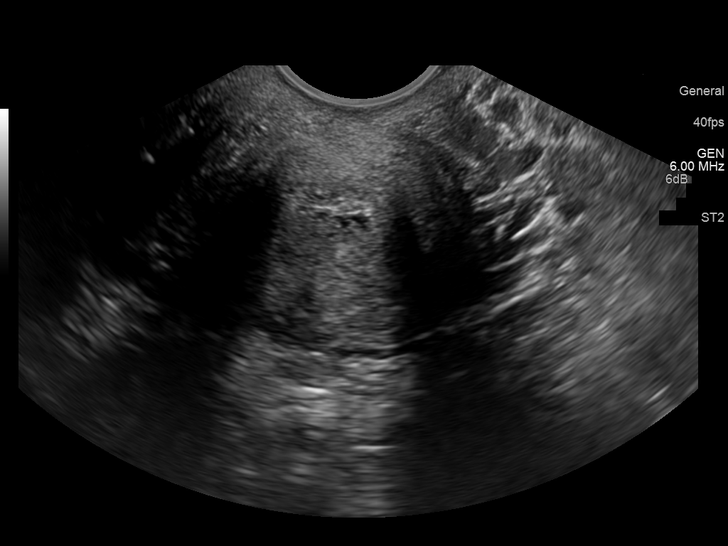
[im 75/97]
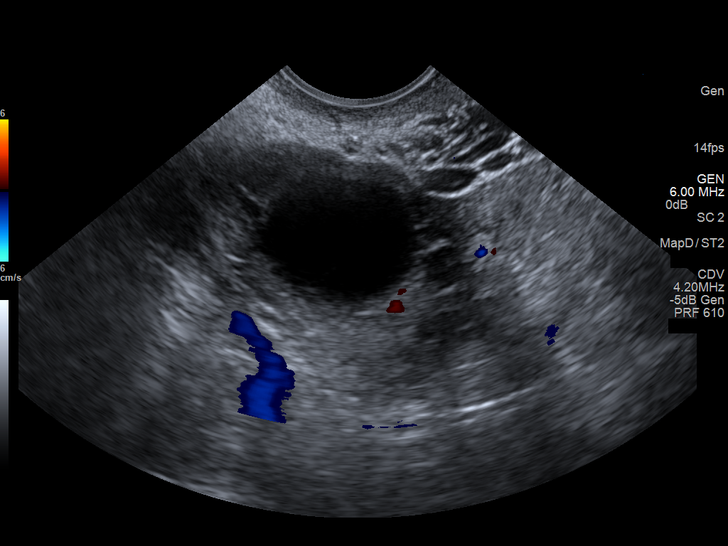
[im 82/97]
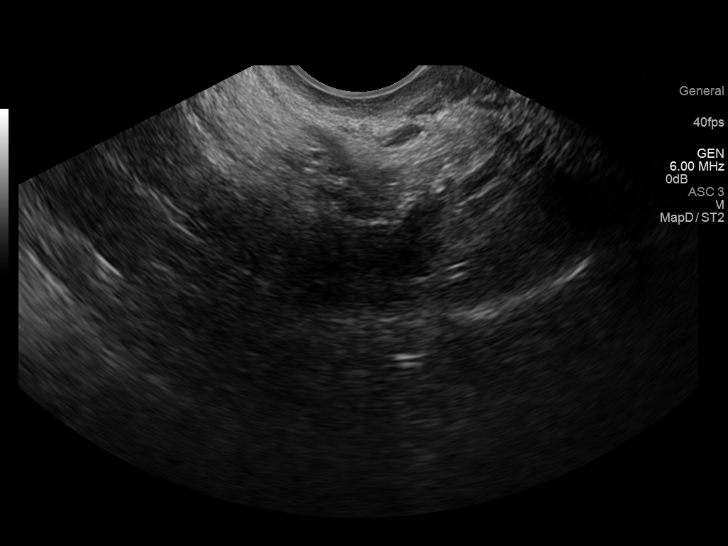
[im 89/97]
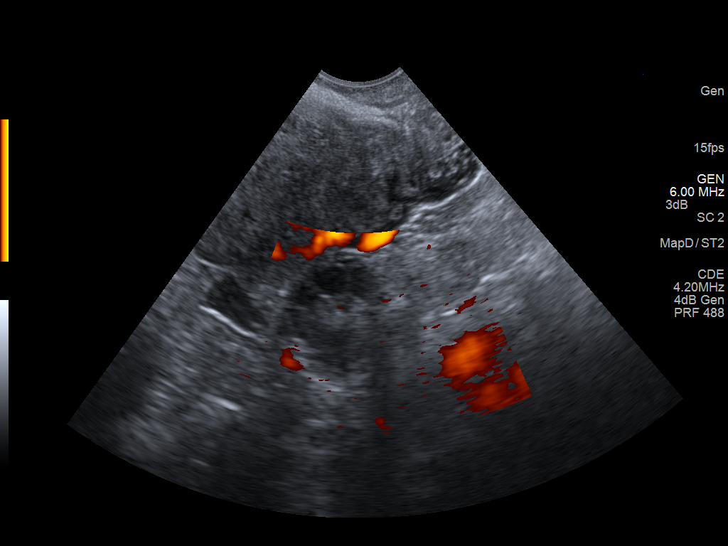
[im 97/97]
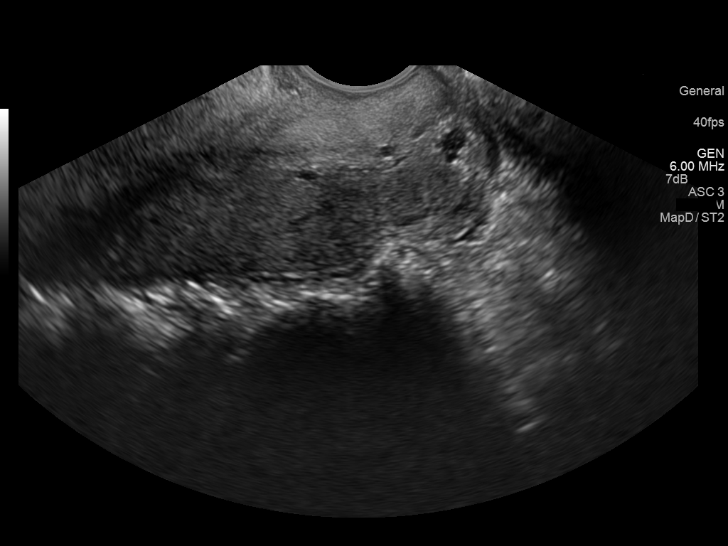

[14 of 28 positions shown; findings below may reference images not displayed]

FINDINGS: Intrauterine gestational sac: None.

Maternal uterus/adnexae: Unremarkable appearance of the uterus.
mm endometrial thickness. Normal appearance of the ovaries, with a
2.7 cm cyst/dominant follicle on the right. No pelvic free fluid.
IMPRESSION: No intrauterine or ectopic pregnancy identified.

## 2017-02-26 ENCOUNTER — Telehealth: Payer: Self-pay | Admitting: Obstetrics and Gynecology

## 2017-02-26 NOTE — Telephone Encounter (Signed)
The patient called and stated that she needs a refill of her Nuva Ring. No other information was disclosed. Please advise.

## 2017-03-03 ENCOUNTER — Other Ambulatory Visit: Payer: Self-pay | Admitting: *Deleted

## 2017-03-03 MED ORDER — ETONOGESTREL-ETHINYL ESTRADIOL 0.12-0.015 MG/24HR VA RING: VAGINAL_RING | VAGINAL | 12 refills | Status: DC

## 2017-03-03 NOTE — Telephone Encounter (Signed)
Done-ac 

## 2017-04-07 IMAGING — US US OB COMP LESS 14 WK
1 series · 13 of 28 positions shown · non-contrast
Comparison: Pelvic ultrasound performed 11/03/2014

CLINICAL DATA: Acute onset of lower abdominal pain and cramping.
Initial encounter.

EXAM:
OBSTETRIC <14 WK US AND TRANSVAGINAL OB US
TECHNIQUE: Both transabdominal and transvaginal ultrasound examinations were
performed for complete evaluation of the gestation as well as the
maternal uterus, adnexal regions, and pelvic cul-de-sac.
Transvaginal technique was performed to assess early pregnancy.

[Series 1: us ob comp less 14 wk · 0.26mm/px · 13 of 77 slices shown]
[im 3/77]
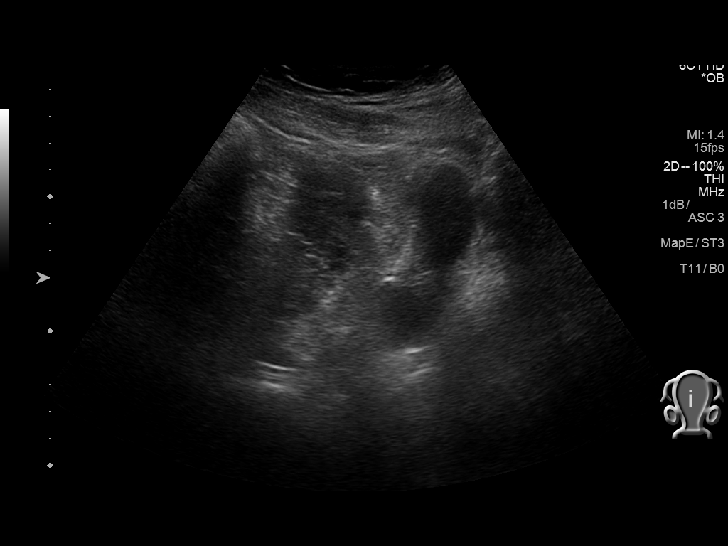
[im 9/77]
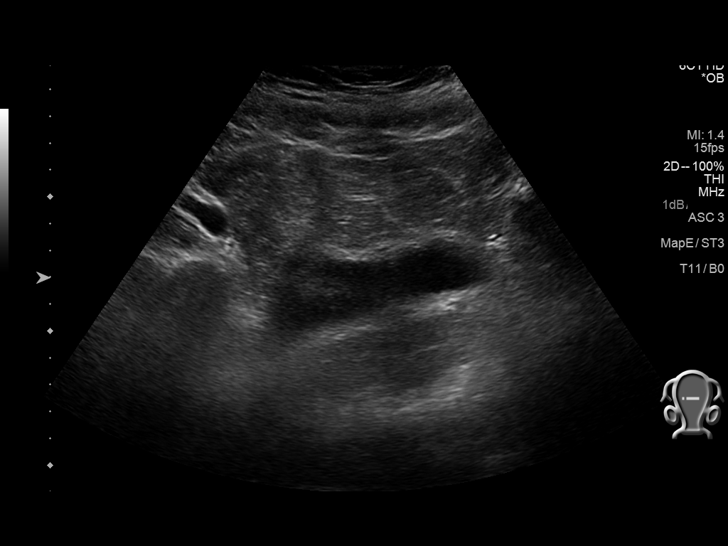
[im 15/77]
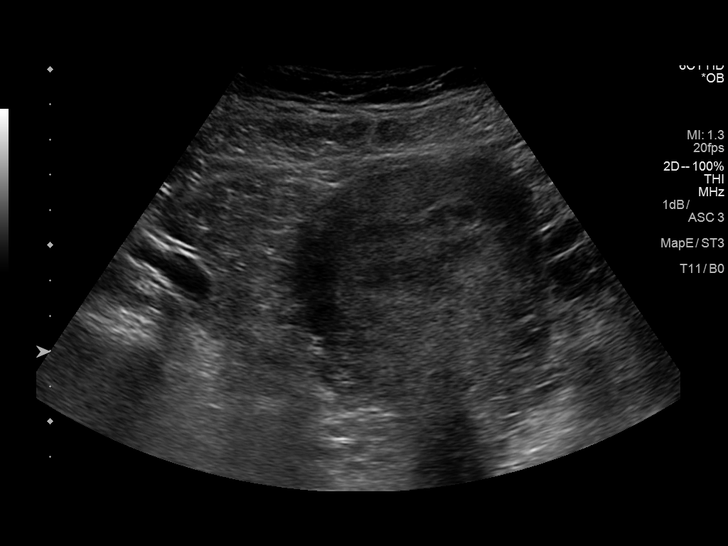
[im 20/77]
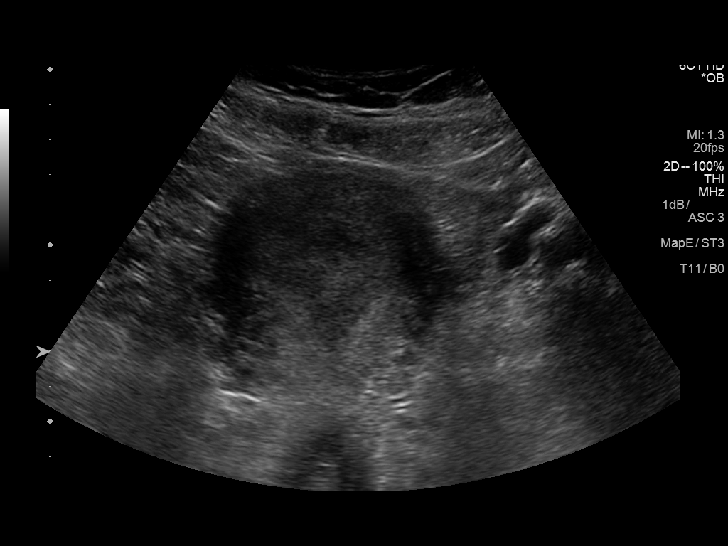
[im 26/77]
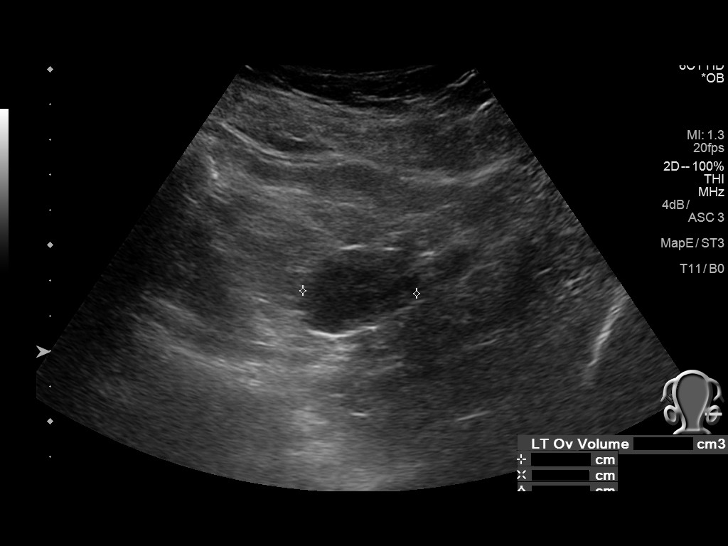
[im 31/77]
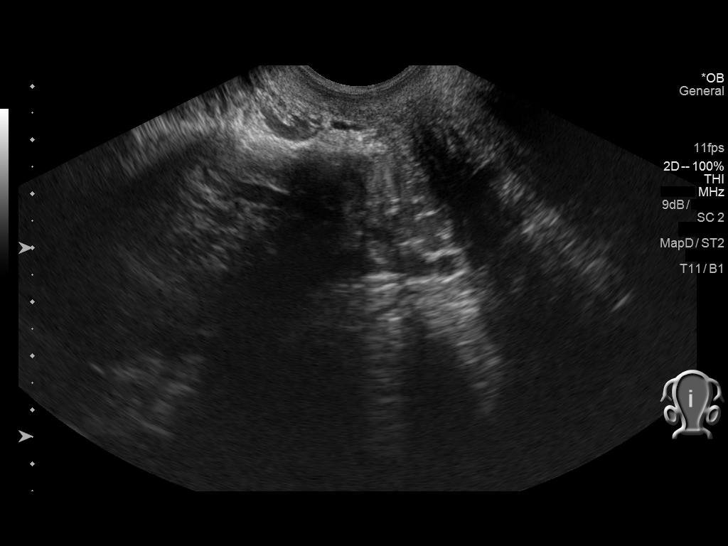
[im 40/77]
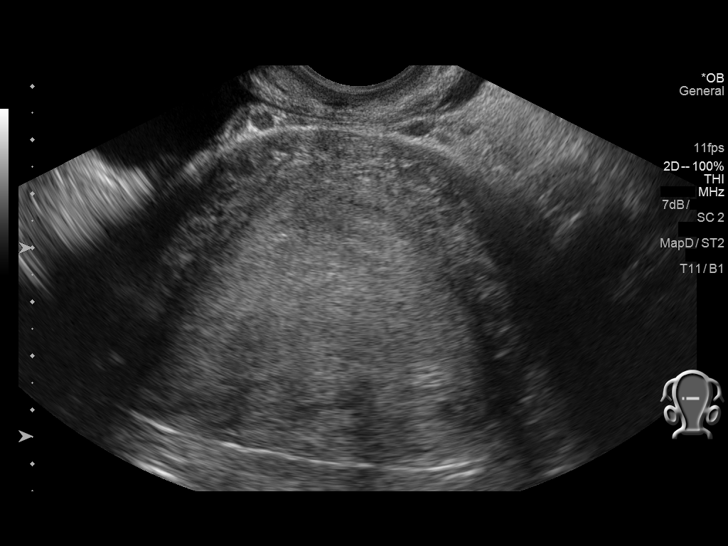
[im 46/77]
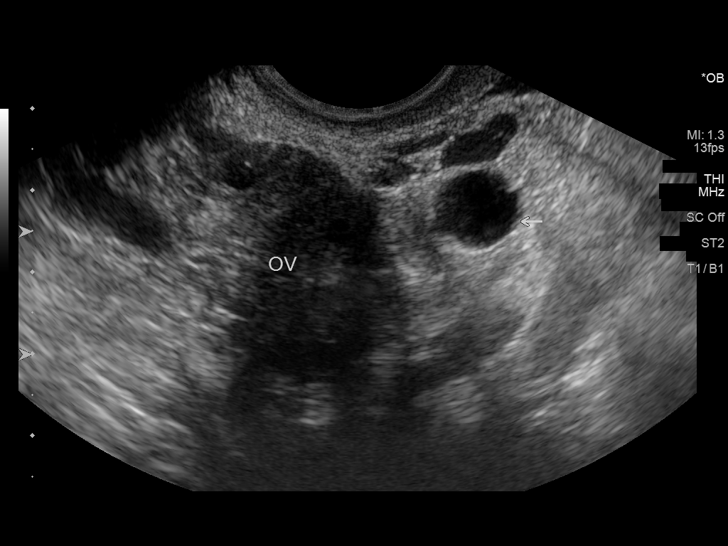
[im 51/77]
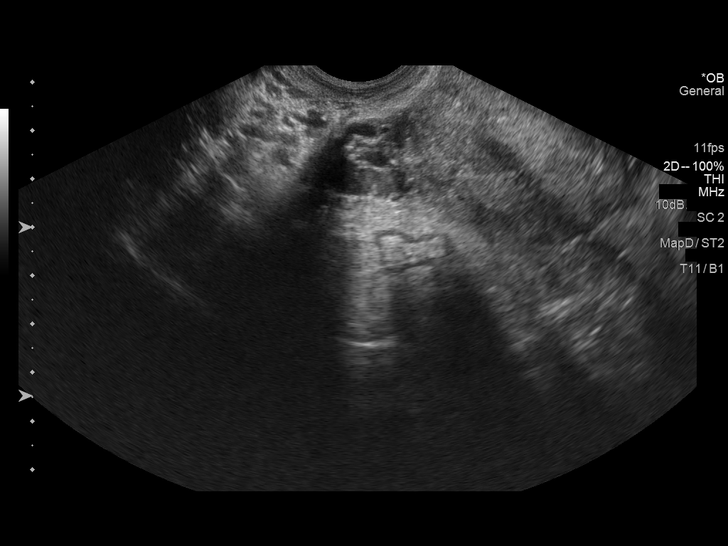
[im 57/77]
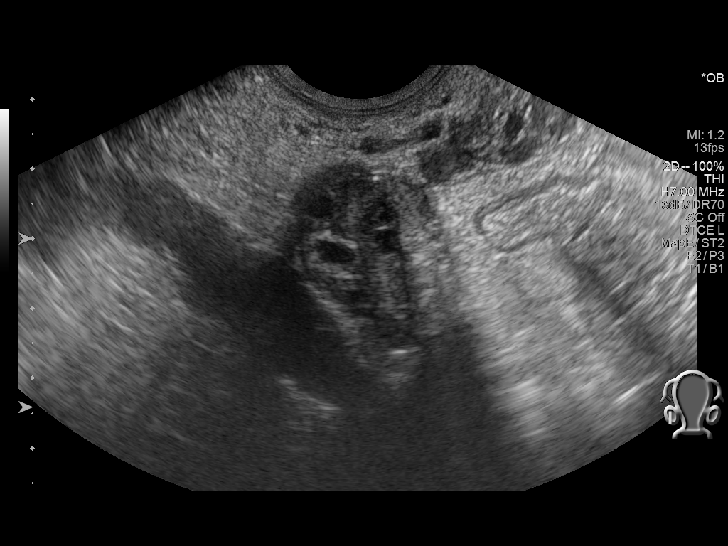
[im 62/77]
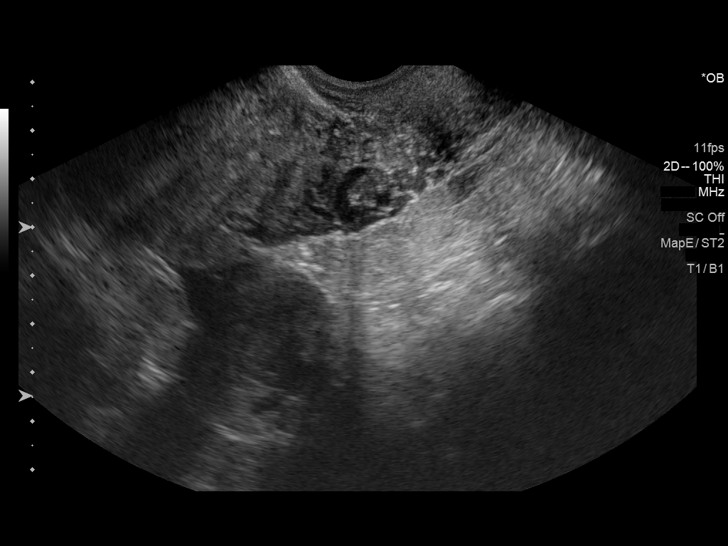
[im 68/77]
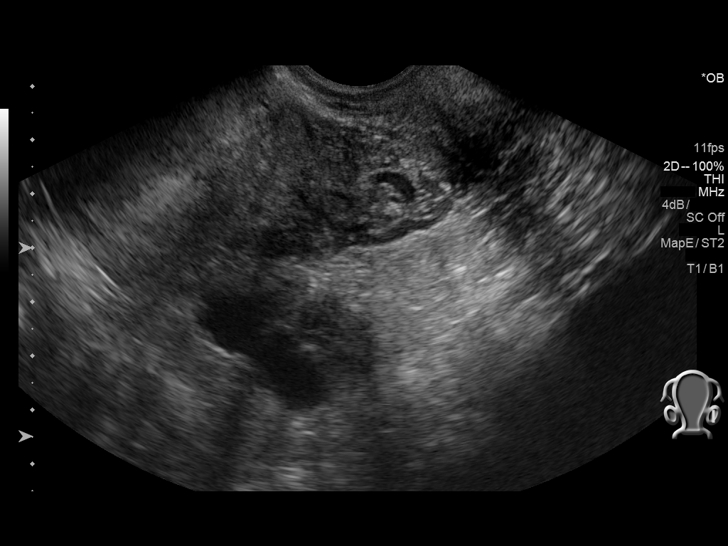
[im 74/77]
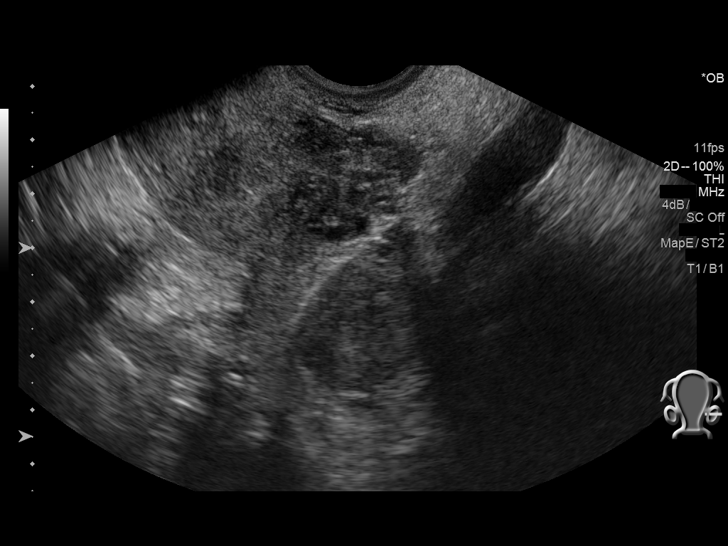

[13 of 28 positions shown; findings below may reference images not displayed]

FINDINGS: Intrauterine gestational sac: Visualized/normal in shape.

Yolk sac:  No

Embryo:  No

Cardiac Activity: N/A

MSD: 0.6 cm   5 w   2  d

Subchorionic hemorrhage:  No subchorionic hemorrhage is noted.

Maternal uterus/adnexae: The uterus is unremarkable in appearance.
The ovaries are unremarkable. The right ovary measures 3.3 x 1.6 x
1.6 cm, while the left ovary measures 5.1 x 2.1 x 2.5 cm. No
suspicious adnexal masses are seen; there is no evidence for ovarian
torsion.

No free fluid is seen within the pelvic cul-de-sac.
IMPRESSION: Single intrauterine gestational sac noted, measuring 6 mm,
corresponding to a gestational age of 5 weeks 2 days. It remains too
early to determine an estimated date of delivery. No yolk sac or
embryo is yet seen, still within normal limits given the size of the
gestational sac. No subchorionic hemorrhage seen. Would perform
follow-up pelvic ultrasound in 2 weeks, if quantitative beta HCG
level continues to trend upward.

## 2017-04-18 ENCOUNTER — Other Ambulatory Visit: Payer: Self-pay | Admitting: *Deleted

## 2017-04-18 ENCOUNTER — Telehealth: Payer: Self-pay | Admitting: Obstetrics and Gynecology

## 2017-04-18 MED ORDER — ACYCLOVIR 400 MG PO TABS: 400.0000 mg | ORAL_TABLET | Freq: Three times a day (TID) | ORAL | 2 refills | Status: DC

## 2017-04-18 NOTE — Telephone Encounter (Signed)
Sent medication in

## 2017-04-18 NOTE — Telephone Encounter (Signed)
The patient called and stated that she needs to speak with her nurse in regards to her needing a refill on a medication due to her having a Herpes outbreak. Please advise.

## 2017-05-02 IMAGING — US US OB TRANSVAGINAL
1 series · 13 of 28 positions shown · non-contrast
Comparison: Pelvic ultrasound performed 01/02/2015

CLINICAL DATA: Acute onset of vaginal bleeding.  Initial encounter.

EXAM:
OBSTETRIC <14 WK US AND TRANSVAGINAL OB US
TECHNIQUE: Both transabdominal and transvaginal ultrasound examinations were
performed for complete evaluation of the gestation as well as the
maternal uterus, adnexal regions, and pelvic cul-de-sac.
Transvaginal technique was performed to assess early pregnancy.

[Series 1: us ob transvaginal · 0.15mm/px · 13 of 86 slices shown]
[im 4/86]
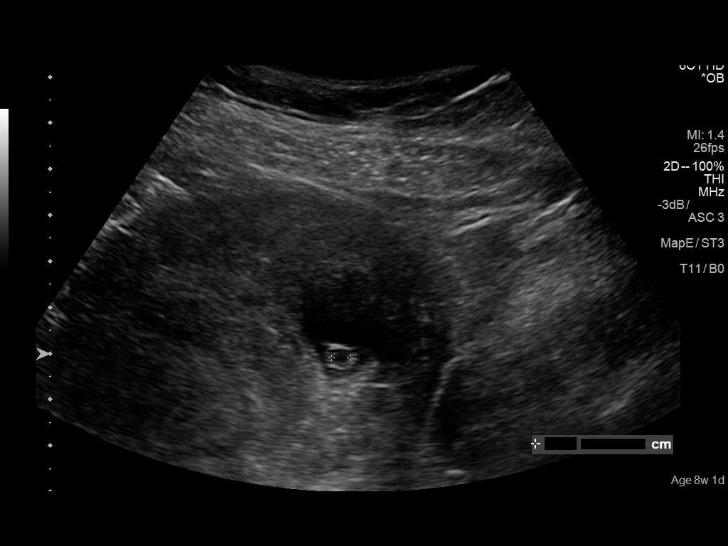
[im 10/86]
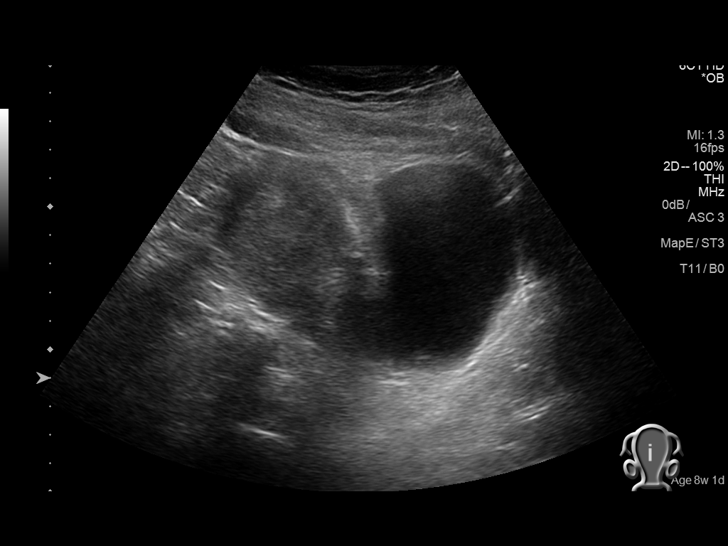
[im 16/86]
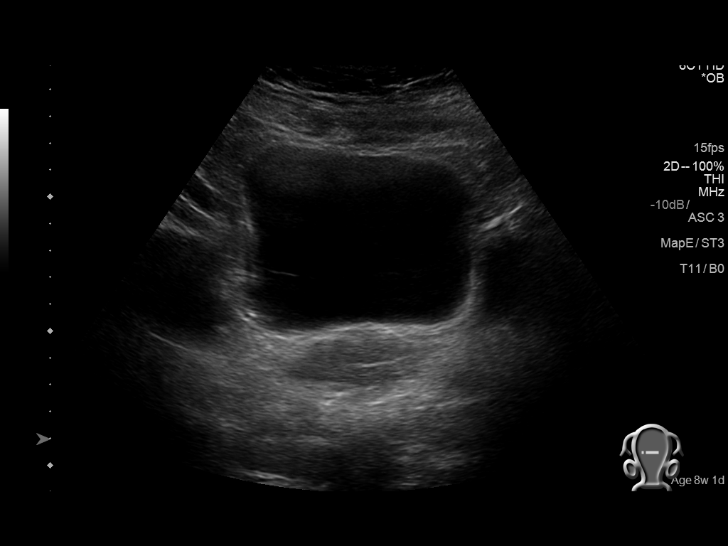
[im 23/86]
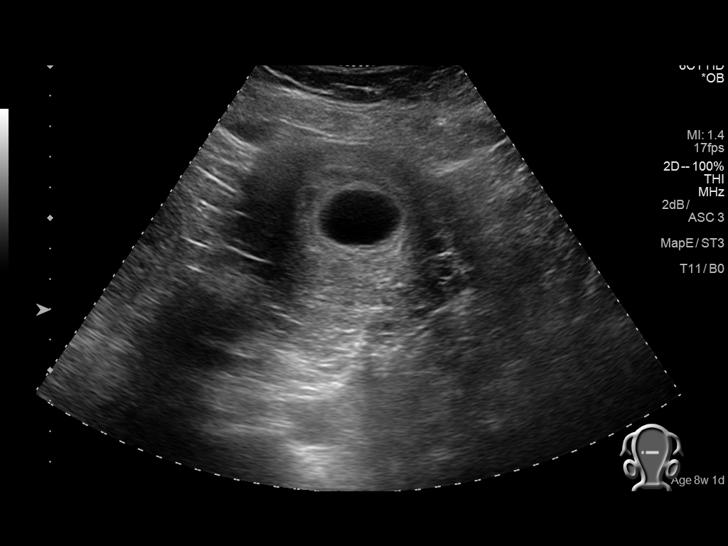
[im 29/86]
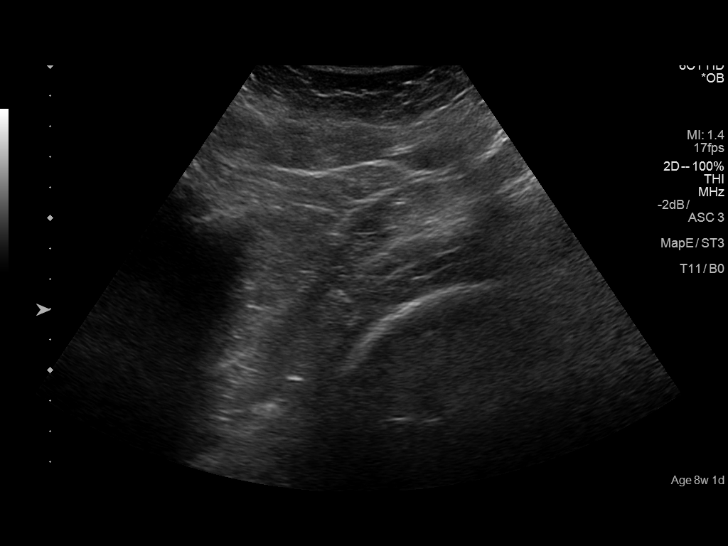
[im 35/86]
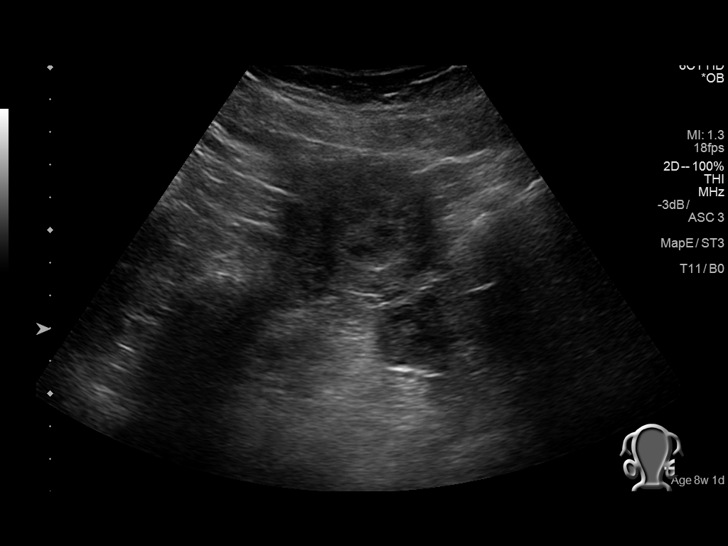
[im 45/86]
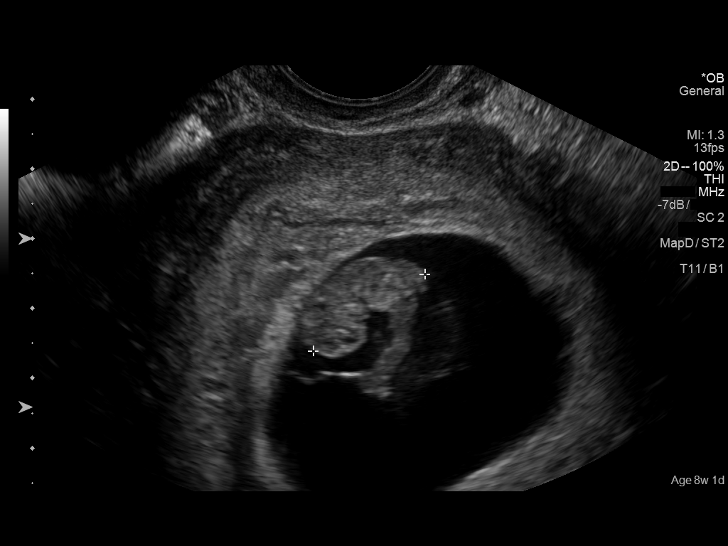
[im 51/86]
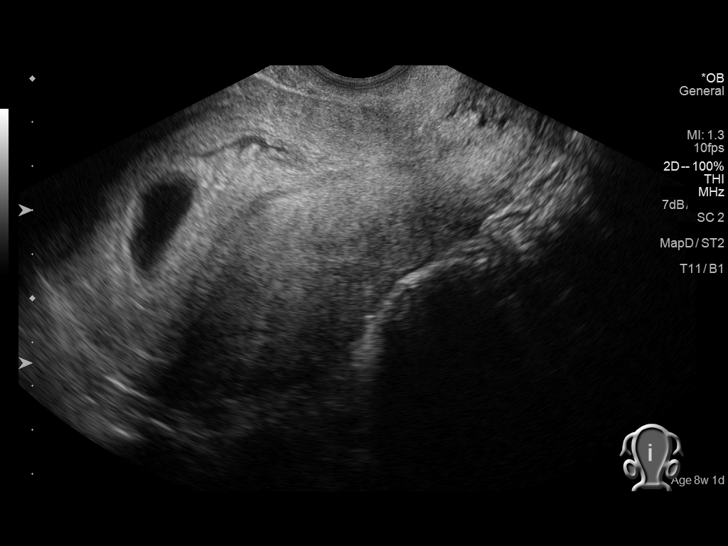
[im 57/86]
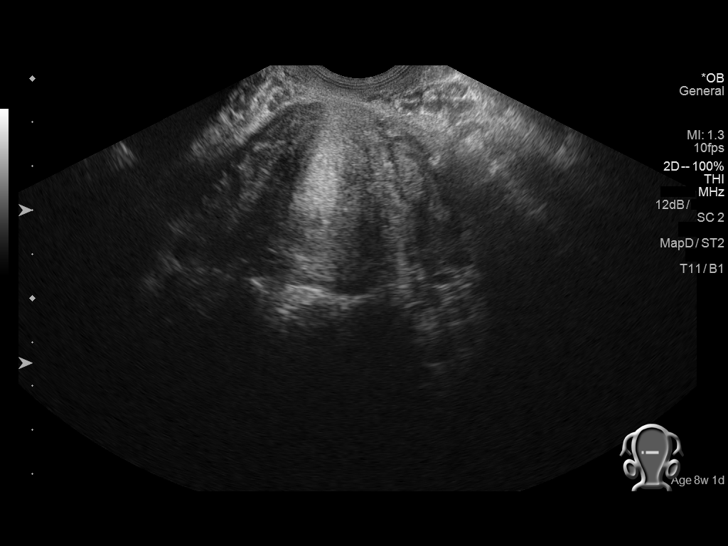
[im 63/86]
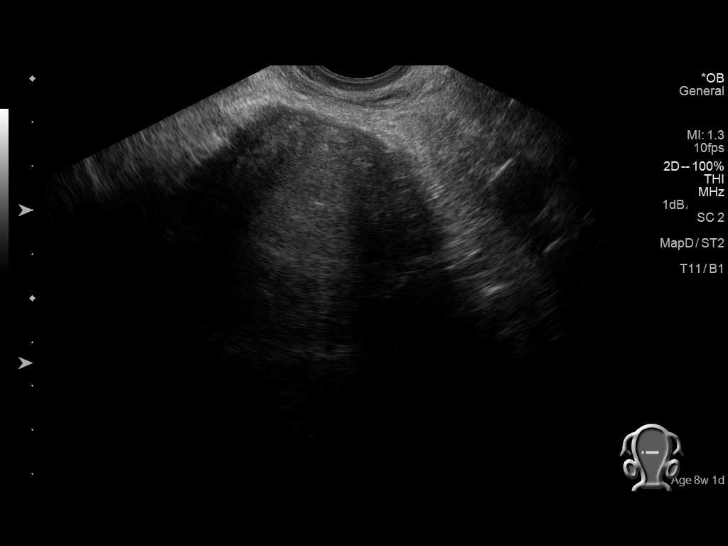
[im 70/86]
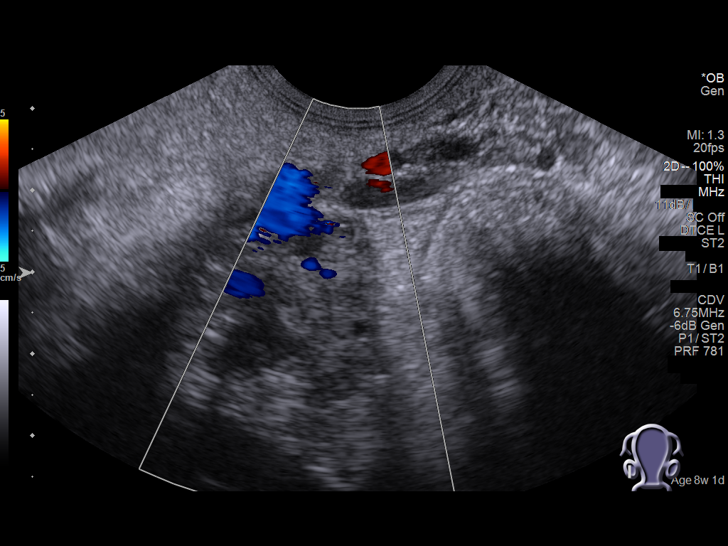
[im 76/86]
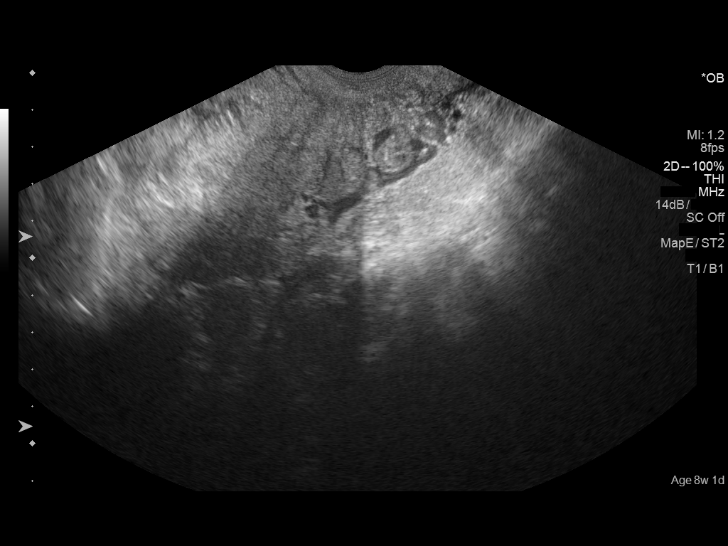
[im 82/86]
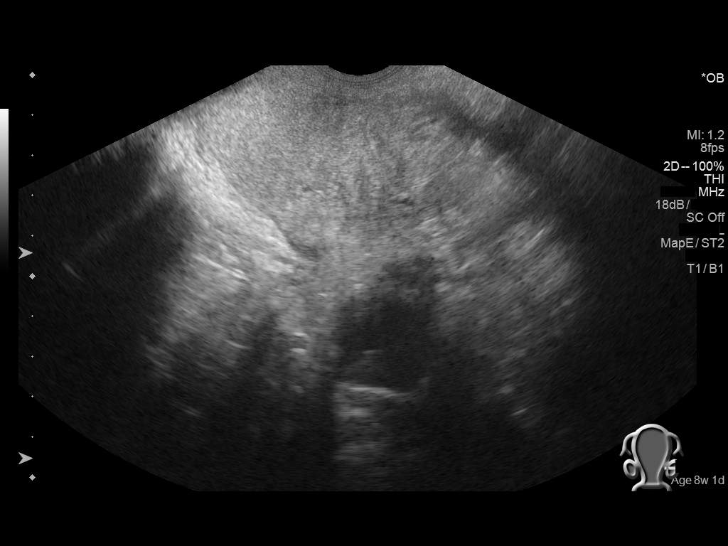

[13 of 28 positions shown; findings below may reference images not displayed]

FINDINGS: Intrauterine gestational sac: Visualized/normal in shape.

Yolk sac:  Yes

Embryo:  Yes

Cardiac Activity: Yes

Heart Rate: 175  bpm

CRL:  2.0 cm   8 w   4 d                  US EDC: 09/04/2015

Subchorionic hemorrhage: A small amount of subchorionic hemorrhage
is noted, measuring 2.0 x 0.5 x 1.6 cm.

Maternal uterus/adnexae: The ovaries are grossly unremarkable in
appearance. The right ovary measures 2.3 x 1.6 x 2.1 cm, while the
left ovary measures 3.3 x 2.3 x 2.6 cm. No suspicious adnexal masses
are seen; there is no evidence for ovarian torsion.

No free fluid is seen within the pelvic cul-de-sac.
IMPRESSION: 1. Single live intrauterine pregnancy noted, with a crown-rump
length of 2.0 cm, corresponding to a gestational age of 8 weeks 4
days. This matches the gestational age of 8 weeks 1 day by LMP,
reflecting an estimated date of delivery September 07, 2015.
2. Small amount of subchorionic hemorrhage noted.

## 2017-05-06 ENCOUNTER — Other Ambulatory Visit: Payer: Self-pay | Admitting: *Deleted

## 2017-05-06 MED ORDER — SCOPOLAMINE 1 MG/3DAYS TD PT72: 1.0000 | MEDICATED_PATCH | TRANSDERMAL | 1 refills | Status: DC

## 2017-05-21 ENCOUNTER — Telehealth: Payer: Self-pay | Admitting: *Deleted

## 2017-05-21 ENCOUNTER — Other Ambulatory Visit: Payer: Self-pay | Admitting: *Deleted

## 2017-05-21 MED ORDER — ETONOGESTREL-ETHINYL ESTRADIOL 0.12-0.015 MG/24HR VA RING: VAGINAL_RING | VAGINAL | 12 refills | Status: DC

## 2017-05-21 NOTE — Telephone Encounter (Signed)
Patient called and states she needs a refill of her Nuva Ring. Patient pharmacy is [facility] in [place]. Please advise. Thank you

## 2017-05-21 NOTE — Telephone Encounter (Signed)
Done-ac 

## 2017-05-27 ENCOUNTER — Ambulatory Visit (INDEPENDENT_AMBULATORY_CARE_PROVIDER_SITE_OTHER): Payer: Medicaid Other | Admitting: Obstetrics and Gynecology

## 2017-05-27 ENCOUNTER — Other Ambulatory Visit (INDEPENDENT_AMBULATORY_CARE_PROVIDER_SITE_OTHER): Payer: Medicaid Other

## 2017-05-27 ENCOUNTER — Encounter: Payer: Self-pay | Admitting: Obstetrics and Gynecology

## 2017-05-27 VITALS — BP 115/65 | HR 80 | Ht 65.0 in | Wt 193.5 lb

## 2017-05-27 DIAGNOSIS — N926 Irregular menstruation, unspecified: Secondary | ICD-10-CM

## 2017-05-27 LAB — POCT URINE PREGNANCY: Preg Test, Ur: POSITIVE — AB

## 2017-05-27 NOTE — Progress Notes (Signed)
tum Subjective:     Patient ID: Krystal Harrison, female   DOB: 1985/06/02, 32 y.o.   MRN: [id number]  HPI Using Nuvaring for [facility], but no menses when she was to place new ring in and took UPT at home and it was positive.  States weird dreams and change in smells which is what alerted her to pregnancy.   Review of Systems    negative except stated above in HPI. Objective:   Physical Exam A&Ox4 Well groomed female in no distress Blood pressure 115/65, pulse 80, height 5\' 5"  (1.651 m), weight 193 lb 8 oz (87.8 kg), last menstrual period 11/28/2016, currently breastfeeding. UPT+ pelvic ultrasound today reveals:  Location: [facility] Date of Service:  05/27/2017  Indications: Dating/Viability; Unknown LMP Findings:  The uterus measures 10.8 x 6.6 x 5.7 cm. The echo texture appears homogeneous without evidence of focal masses. The endometrium measures 12.0 mm.   No evidence of conception is identified at this time.  An ectopic pregnancy cannot be ruled out at this time. ? Early pregnancy.  Right Ovary measures 3.4 x 2.2 x 3.0 cm and appears to contain a corpus luteal cyst.  A smaller anechoic area with what appears to be decidual reaction surrounding it is also noted within the right ovary.  Cannot rule out ectopic pregnancy at this time.  Left Ovary measures 3.2 x 1.7 x 1.8 cm. It is normal appearance. An exophytic cystic structure is noted in each adnexa.  The right measures 1.1 x 0.9 x 1.3 cm and left measures 1.1 x 1.2 x 1.4 cm.  There is evidence of a corpus luteal cyst in the right ovary. Survey of the adnexa demonstrates no adnexal masses. There is no free peritoneal fluid in the cul de sac.  Impression: 1. Evidence of conception is not identified at this time. ? Early pregnancy vs other etiology. 2. The endometrium measures 12.0 mm. 3. Corpus luteal cyst noted in right ovary. 4. Right ovary contains a small anechoic area with what appears to be decidual reaction  surrounding it. Cannot rule out ectopic pregnancy at this time. 5. Each adnexa demonstrated exophytic cystic structures.  Right = 1.1 x 0.9 x 1.3 cm  Left = 1.1 x 1.2 x 1.4 cm.    Assessment:     Missed menses Lactational amenorrhea Obesity Early pregnancy vs.possible ectopic     Plan:     Scan for dates and viability will be repeated in 2-3 weeks or earlier if labs indicate. Bhcg obtained today and will repeat in 48 hours.  Counseled on findings   Dawn Kiper,CNM

## 2017-05-27 NOTE — Patient Instructions (Signed)
First Trimester of Pregnancy The first trimester of pregnancy is from week 1 until the end of week 13 (months 1 through 3). During this time, your baby will begin to develop inside you. At 6-8 weeks, the eyes and face are formed, and the heartbeat can be seen on ultrasound. At the end of 12 weeks, all the baby's organs are formed. Prenatal care is all the medical care you receive before the birth of your baby. Make sure you get good prenatal care and follow all of your doctor's instructions. Follow these instructions at home: Medicines  Take over-the-counter and prescription medicines only as told by your doctor. Some medicines are safe and some medicines are not safe during pregnancy.  Take a prenatal vitamin that contains at least 600 micrograms (mcg) of folic acid.  If you have trouble pooping (constipation), take medicine that will make your stool soft (stool softener) if your doctor approves. Eating and drinking  Eat regular, healthy meals.  Your doctor will tell you the amount of weight gain that is right for you.  Avoid raw meat and uncooked cheese.  If you feel sick to your stomach (nauseous) or throw up (vomit): ? Eat 4 or 5 small meals a day instead of 3 large meals. ? Try eating a few soda crackers. ? Drink liquids between meals instead of during meals.  To prevent constipation: ? Eat foods that are high in fiber, like fresh fruits and vegetables, whole grains, and beans. ? Drink enough fluids to keep your pee (urine) clear or pale yellow. Activity  Exercise only as told by your doctor. Stop exercising if you have cramps or pain in your lower belly (abdomen) or low back.  Do not exercise if it is too hot, too humid, or if you are in a place of great height (high altitude).  Try to avoid standing for long periods of time. Move your legs often if you must stand in one place for a long time.  Avoid heavy lifting.  Wear low-heeled shoes. Sit and stand up straight.  You  can have sex unless your doctor tells you not to. Relieving pain and discomfort  Wear a good support bra if your breasts are sore.  Take warm water baths (sitz baths) to soothe pain or discomfort caused by hemorrhoids. Use hemorrhoid cream if your doctor says it is okay.  Rest with your legs raised if you have leg cramps or low back pain.  If you have puffy, bulging veins (varicose veins) in your legs: ? Wear support hose or compression stockings as told by your doctor. ? Raise (elevate) your feet for 15 minutes, 3-4 times a day. ? Limit salt in your food. Prenatal care  Schedule your prenatal visits by the twelfth week of pregnancy.  Write down your questions. Take them to your prenatal visits.  Keep all your prenatal visits as told by your doctor. This is important. Safety  Wear your seat belt at all times when driving.  Make a list of emergency phone numbers. The list should include numbers for family, friends, the hospital, and police and fire departments. General instructions  Ask your doctor for a referral to a local prenatal class. Begin classes no later than at the start of month 6 of your pregnancy.  Ask for help if you need counseling or if you need help with nutrition. Your doctor can give you advice or tell you where to go for help.  Do not use hot tubs, steam rooms, or   saunas.  Do not douche or use tampons or scented sanitary pads.  Do not cross your legs for long periods of time.  Avoid all herbs and alcohol. Avoid drugs that are not approved by your doctor.  Do not use any tobacco products, including cigarettes, chewing tobacco, and electronic cigarettes. If you need help quitting, ask your doctor. You may get counseling or other support to help you quit.  Avoid cat litter boxes and soil used by cats. These carry germs that can cause birth defects in the baby and can cause a loss of your baby (miscarriage) or stillbirth.  Visit your dentist. At home, brush  your teeth with a soft toothbrush. Be gentle when you floss. Contact a doctor if:  You are dizzy.  You have mild cramps or pressure in your lower belly.  You have a nagging pain in your belly area.  You continue to feel sick to your stomach, you throw up, or you have watery poop (diarrhea).  You have a bad smelling fluid coming from your vagina.  You have pain when you pee (urinate).  You have increased puffiness (swelling) in your face, hands, legs, or ankles. Get help right away if:  You have a fever.  You are leaking fluid from your vagina.  You have spotting or bleeding from your vagina.  You have very bad belly cramping or pain.  You gain or lose weight rapidly.  You throw up blood. It may look like coffee grounds.  You are around people who have German measles, fifth disease, or chickenpox.  You have a very bad headache.  You have shortness of breath.  You have any kind of trauma, such as from a fall or a car accident. Summary  The first trimester of pregnancy is from week 1 until the end of week 13 (months 1 through 3).  To take care of yourself and your unborn baby, you will need to eat healthy meals, take medicines only if your doctor tells you to do so, and do activities that are safe for you and your baby.  Keep all follow-up visits as told by your doctor. This is important as your doctor will have to ensure that your baby is healthy and growing well. This information is not intended to replace advice given to you by your health care provider. Make sure you discuss any questions you have with your health care provider. Document Released: 06/12/2007 Document Revised: 01/02/2016 Document Reviewed: 01/02/2016 Elsevier Interactive Patient Education  2017 [employer]  

## 2017-05-28 ENCOUNTER — Telehealth: Payer: Self-pay | Admitting: *Deleted

## 2017-05-28 LAB — BETA HCG QUANT (REF LAB): hCG Quant: 113 m[IU]/mL

## 2017-05-28 NOTE — Telephone Encounter (Signed)
pls advise

## 2017-05-28 NOTE — Telephone Encounter (Signed)
Patient called and is inquiring about her lab results. Patient is requesting a call back. Her call back # is [phone]. Please advise. Thank you

## 2017-05-29 ENCOUNTER — Other Ambulatory Visit: Payer: Medicaid Other

## 2017-05-29 DIAGNOSIS — N926 Irregular menstruation, unspecified: Secondary | ICD-10-CM

## 2017-05-30 ENCOUNTER — Telehealth: Payer: Self-pay | Admitting: Obstetrics and Gynecology

## 2017-05-30 LAB — BETA HCG QUANT (REF LAB): hCG Quant: 307 m[IU]/mL

## 2017-05-30 NOTE — Telephone Encounter (Signed)
The patient called to check on her results. No other information was disclosed. Please advise.

## 2017-06-03 ENCOUNTER — Other Ambulatory Visit: Payer: Medicaid Other

## 2017-06-03 DIAGNOSIS — N926 Irregular menstruation, unspecified: Secondary | ICD-10-CM

## 2017-06-03 NOTE — Telephone Encounter (Signed)
Pt came in did labs

## 2017-06-04 ENCOUNTER — Encounter: Payer: Self-pay | Admitting: Obstetrics and Gynecology

## 2017-06-04 LAB — BETA HCG QUANT (REF LAB): hCG Quant: 1950 m[IU]/mL

## 2017-06-08 ENCOUNTER — Encounter: Payer: Self-pay | Admitting: Obstetrics and Gynecology

## 2017-06-09 ENCOUNTER — Other Ambulatory Visit: Payer: Self-pay | Admitting: Obstetrics and Gynecology

## 2017-06-09 DIAGNOSIS — N926 Irregular menstruation, unspecified: Secondary | ICD-10-CM

## 2017-06-10 ENCOUNTER — Other Ambulatory Visit: Payer: Medicaid Other

## 2017-06-10 ENCOUNTER — Ambulatory Visit (INDEPENDENT_AMBULATORY_CARE_PROVIDER_SITE_OTHER): Payer: Medicaid Other

## 2017-06-10 DIAGNOSIS — N926 Irregular menstruation, unspecified: Secondary | ICD-10-CM | POA: Diagnosis not present

## 2017-06-11 ENCOUNTER — Encounter: Payer: Self-pay | Admitting: Obstetrics and Gynecology

## 2017-06-11 ENCOUNTER — Telehealth: Payer: Self-pay | Admitting: Obstetrics and Gynecology

## 2017-06-11 LAB — PROGESTERONE: Progesterone: 12.4 ng/mL

## 2017-06-11 LAB — BETA HCG QUANT (REF LAB): hCG Quant: 19821 m[IU]/mL

## 2017-06-11 NOTE — Telephone Encounter (Signed)
MNS responded to pt

## 2017-06-11 NOTE — Telephone Encounter (Signed)
The patient called and stated that she was checking on the status of her results. Please advise.

## 2017-06-16 ENCOUNTER — Other Ambulatory Visit: Payer: Self-pay | Admitting: *Deleted

## 2017-06-16 ENCOUNTER — Encounter: Payer: Self-pay | Admitting: Obstetrics and Gynecology

## 2017-06-16 MED ORDER — ONDANSETRON 4 MG PO TBDP: 4.0000 mg | ORAL_TABLET | Freq: Three times a day (TID) | ORAL | 0 refills | Status: DC | PRN

## 2017-06-18 ENCOUNTER — Other Ambulatory Visit: Payer: Self-pay | Admitting: *Deleted

## 2017-06-18 ENCOUNTER — Encounter: Payer: Self-pay | Admitting: Obstetrics and Gynecology

## 2017-06-18 DIAGNOSIS — N926 Irregular menstruation, unspecified: Secondary | ICD-10-CM

## 2017-06-30 ENCOUNTER — Encounter: Payer: Self-pay | Admitting: Obstetrics and Gynecology

## 2017-07-07 ENCOUNTER — Ambulatory Visit: Payer: Medicaid Other | Admitting: Obstetrics and Gynecology

## 2017-07-07 ENCOUNTER — Other Ambulatory Visit (HOSPITAL_COMMUNITY)
Admission: RE | Admit: 2017-07-07 | Discharge: 2017-07-07 | Disposition: A | Discharge status: Home / Self Care | Payer: Medicaid Other | Source: Ambulatory Visit | Attending: Obstetrics and Gynecology | Admitting: Obstetrics and Gynecology

## 2017-07-07 ENCOUNTER — Ambulatory Visit (INDEPENDENT_AMBULATORY_CARE_PROVIDER_SITE_OTHER): Payer: Medicaid Other

## 2017-07-07 VITALS — BP 115/65 | HR 98 | Ht 65.0 in | Wt 196.0 lb

## 2017-07-07 DIAGNOSIS — Z3A09 9 weeks gestation of pregnancy: Secondary | ICD-10-CM

## 2017-07-07 DIAGNOSIS — O3680X0 Pregnancy with inconclusive fetal viability, not applicable or unspecified: Secondary | ICD-10-CM | POA: Diagnosis not present

## 2017-07-07 DIAGNOSIS — Z3401 Encounter for supervision of normal first pregnancy, first trimester: Secondary | ICD-10-CM | POA: Diagnosis present

## 2017-07-07 DIAGNOSIS — N926 Irregular menstruation, unspecified: Secondary | ICD-10-CM

## 2017-07-07 LAB — OB RESULTS CONSOLE VARICELLA ZOSTER ANTIBODY, IGG: Varicella: IMMUNE

## 2017-07-07 NOTE — Progress Notes (Signed)
Krystal Harrison presents for NOB nurse interview visit. Pregnancy confirmation done here at [facility]. G-6 .  P- 3  . Pregnancy education material explained and given.There is cats in the home.kids have been changing litter box NOB labs ordered. (TSH/HbgA1c due to Increased BMI), . HIV labs and Drug screen were explained optional and she did not decline. Drug screen ordered PNV encouraged. Genetic screening options discussed. Genetic testing: Ordered.  Pt may discuss with provider. Pt. To follow up with provider in 2__ weeks for NOB physical.  All questions answered.

## 2017-07-07 NOTE — Patient Instructions (Signed)
First Trimester of Pregnancy The first trimester of pregnancy is from week 1 until the end of week 13 (months 1 through 3). During this time, your baby will begin to develop inside you. At 6-8 weeks, the eyes and face are formed, and the heartbeat can be seen on ultrasound. At the end of 12 weeks, all the baby's organs are formed. Prenatal care is all the medical care you receive before the birth of your baby. Make sure you get good prenatal care and follow all of your doctor's instructions. Follow these instructions at home: Medicines  Take over-the-counter and prescription medicines only as told by your doctor. Some medicines are safe and some medicines are not safe during pregnancy.  Take a prenatal vitamin that contains at least 600 micrograms (mcg) of folic acid.  If you have trouble pooping (constipation), take medicine that will make your stool soft (stool softener) if your doctor approves. Eating and drinking  Eat regular, healthy meals.  Your doctor will tell you the amount of weight gain that is right for you.  Avoid raw meat and uncooked cheese.  If you feel sick to your stomach (nauseous) or throw up (vomit): ? Eat 4 or 5 small meals a day instead of 3 large meals. ? Try eating a few soda crackers. ? Drink liquids between meals instead of during meals.  To prevent constipation: ? Eat foods that are high in fiber, like fresh fruits and vegetables, whole grains, and beans. ? Drink enough fluids to keep your pee (urine) clear or pale yellow. Activity  Exercise only as told by your doctor. Stop exercising if you have cramps or pain in your lower belly (abdomen) or low back.  Do not exercise if it is too hot, too humid, or if you are in a place of great height (high altitude).  Try to avoid standing for long periods of time. Move your legs often if you must stand in one place for a long time.  Avoid heavy lifting.  Wear low-heeled shoes. Sit and stand up straight.  You  can have sex unless your doctor tells you not to. Relieving pain and discomfort  Wear a good support bra if your breasts are sore.  Take warm water baths (sitz baths) to soothe pain or discomfort caused by hemorrhoids. Use hemorrhoid cream if your doctor says it is okay.  Rest with your legs raised if you have leg cramps or low back pain.  If you have puffy, bulging veins (varicose veins) in your legs: ? Wear support hose or compression stockings as told by your doctor. ? Raise (elevate) your feet for 15 minutes, 3-4 times a day. ? Limit salt in your food. Prenatal care  Schedule your prenatal visits by the twelfth week of pregnancy.  Write down your questions. Take them to your prenatal visits.  Keep all your prenatal visits as told by your doctor. This is important. Safety  Wear your seat belt at all times when driving.  Make a list of emergency phone numbers. The list should include numbers for family, friends, the hospital, and police and fire departments. General instructions  Ask your doctor for a referral to a local prenatal class. Begin classes no later than at the start of month 6 of your pregnancy.  Ask for help if you need counseling or if you need help with nutrition. Your doctor can give you advice or tell you where to go for help.  Do not use hot tubs, steam rooms, or   saunas.  Do not douche or use tampons or scented sanitary pads.  Do not cross your legs for long periods of time.  Avoid all herbs and alcohol. Avoid drugs that are not approved by your doctor.  Do not use any tobacco products, including cigarettes, chewing tobacco, and electronic cigarettes. If you need help quitting, ask your doctor. You may get counseling or other support to help you quit.  Avoid cat litter boxes and soil used by cats. These carry germs that can cause birth defects in the baby and can cause a loss of your baby (miscarriage) or stillbirth.  Visit your dentist. At home, brush  your teeth with a soft toothbrush. Be gentle when you floss. Contact a doctor if:  You are dizzy.  You have mild cramps or pressure in your lower belly.  You have a nagging pain in your belly area.  You continue to feel sick to your stomach, you throw up, or you have watery poop (diarrhea).  You have a bad smelling fluid coming from your vagina.  You have pain when you pee (urinate).  You have increased puffiness (swelling) in your face, hands, legs, or ankles. Get help right away if:  You have a fever.  You are leaking fluid from your vagina.  You have spotting or bleeding from your vagina.  You have very bad belly cramping or pain.  You gain or lose weight rapidly.  You throw up blood. It may look like coffee grounds.  You are around people who have German measles, fifth disease, or chickenpox.  You have a very bad headache.  You have shortness of breath.  You have any kind of trauma, such as from a fall or a car accident. Summary  The first trimester of pregnancy is from week 1 until the end of week 13 (months 1 through 3).  To take care of yourself and your unborn baby, you will need to eat healthy meals, take medicines only if your doctor tells you to do so, and do activities that are safe for you and your baby.  Keep all follow-up visits as told by your doctor. This is important as your doctor will have to ensure that your baby is healthy and growing well. This information is not intended to replace advice given to you by your health care provider. Make sure you discuss any questions you have with your health care provider. Document Released: 06/12/2007 Document Revised: 01/02/2016 Document Reviewed: 01/02/2016 Elsevier Interactive Patient Education  2017 [employer]  

## 2017-07-08 LAB — URINALYSIS, ROUTINE W REFLEX MICROSCOPIC
Bilirubin, UA: NEGATIVE
Glucose, UA: NEGATIVE
Ketones, UA: NEGATIVE
Leukocytes, UA: NEGATIVE
Nitrite, UA: NEGATIVE
Protein, UA: NEGATIVE
Specific Gravity, UA: 1.022 (ref 1.005–1.030)
Urobilinogen, Ur: 1 mg/dL (ref 0.2–1.0)
pH, UA: 7 (ref 5.0–7.5)

## 2017-07-08 LAB — CBC WITH DIFFERENTIAL/PLATELET
Basophils Absolute: 0 10*3/uL (ref 0.0–0.2)
Basos: 0 %
EOS (ABSOLUTE): 0.1 10*3/uL (ref 0.0–0.4)
Eos: 1 %
Hematocrit: 38.6 % (ref 34.0–46.6)
Hemoglobin: 12.8 g/dL (ref 11.1–15.9)
Immature Grans (Abs): 0 10*3/uL (ref 0.0–0.1)
Immature Granulocytes: 0 %
Lymphocytes Absolute: 2.6 10*3/uL (ref 0.7–3.1)
Lymphs: 27 %
MCH: 28.2 pg (ref 26.6–33.0)
MCHC: 33.2 g/dL (ref 31.5–35.7)
MCV: 85 fL (ref 79–97)
Monocytes Absolute: 0.9 10*3/uL (ref 0.1–0.9)
Monocytes: 9 %
Neutrophils Absolute: 6.2 10*3/uL (ref 1.4–7.0)
Neutrophils: 63 %
Platelets: 260 10*3/uL (ref 150–450)
RBC: 4.54 x10E6/uL (ref 3.77–5.28)
RDW: 14.9 % (ref 12.3–15.4)
WBC: 9.8 10*3/uL (ref 3.4–10.8)

## 2017-07-08 LAB — MICROSCOPIC EXAMINATION: Casts: NONE SEEN /lpf

## 2017-07-08 LAB — ANTIBODY SCREEN: Antibody Screen: NEGATIVE

## 2017-07-08 LAB — HIV ANTIBODY (ROUTINE TESTING W REFLEX): HIV Screen 4th Generation wRfx: NONREACTIVE

## 2017-07-08 LAB — TSH: TSH: 1.07 u[IU]/mL (ref 0.450–4.500)

## 2017-07-08 LAB — RUBELLA SCREEN: Rubella Antibodies, IGG: 2.93 index (ref >0.99)

## 2017-07-08 LAB — HEMOGLOBIN A1C
Est. average glucose Bld gHb Est-mCnc: 100 mg/dL
Hgb A1c MFr Bld: 5.1 % (ref 4.8–5.6)

## 2017-07-08 LAB — GC/CHLAMYDIA PROBE AMP (~~LOC~~) NOT AT ARMC
Chlamydia: NEGATIVE
Neisseria Gonorrhea: NEGATIVE

## 2017-07-08 LAB — ABO AND RH: Rh Factor: POSITIVE

## 2017-07-08 LAB — RPR: RPR Ser Ql: NONREACTIVE

## 2017-07-08 LAB — VARICELLA ZOSTER ANTIBODY, IGG: Varicella zoster IgG: 438 index (ref >165)

## 2017-07-08 LAB — HEPATITIS B SURFACE ANTIGEN: Hepatitis B Surface Ag: NEGATIVE

## 2017-07-09 LAB — MONITOR DRUG PROFILE 14(MW)
Amphetamine Scrn, Ur: NEGATIVE ng/mL
BARBITURATE SCREEN URINE: NEGATIVE ng/mL
BENZODIAZEPINE SCREEN, URINE: NEGATIVE ng/mL
Buprenorphine, Urine: NEGATIVE ng/mL
CANNABINOIDS UR QL SCN: NEGATIVE ng/mL
Cocaine (Metab) Scrn, Ur: NEGATIVE ng/mL
Creatinine(Crt), U: 110.4 mg/dL (ref 20.0–300.0)
Fentanyl, Urine: NEGATIVE pg/mL
Meperidine Screen, Urine: NEGATIVE ng/mL
Methadone Screen, Urine: NEGATIVE ng/mL
OXYCODONE+OXYMORPHONE UR QL SCN: NEGATIVE ng/mL
Opiate Scrn, Ur: NEGATIVE ng/mL
Ph of Urine: 7.1 (ref 4.5–8.9)
Phencyclidine Qn, Ur: NEGATIVE ng/mL
Propoxyphene Scrn, Ur: NEGATIVE ng/mL
SPECIFIC GRAVITY: 1.02
Tramadol Screen, Urine: NEGATIVE ng/mL

## 2017-07-09 LAB — URINE CULTURE: Organism ID, Bacteria: NO GROWTH

## 2017-07-13 LAB — MATERNIT 21 PLUS CORE, BLOOD
Chromosome 13: NEGATIVE
Chromosome 18: NEGATIVE
Chromosome 21: NEGATIVE
Y Chromosome: NOT DETECTED

## 2017-07-23 ENCOUNTER — Ambulatory Visit (INDEPENDENT_AMBULATORY_CARE_PROVIDER_SITE_OTHER): Payer: Medicaid Other | Admitting: Obstetrics and Gynecology

## 2017-07-23 VITALS — BP 111/72 | HR 91 | Wt 203.1 lb

## 2017-07-23 DIAGNOSIS — Z3492 Encounter for supervision of normal pregnancy, unspecified, second trimester: Secondary | ICD-10-CM | POA: Diagnosis not present

## 2017-07-23 LAB — POCT URINALYSIS DIPSTICK
Bilirubin, UA: NEGATIVE
Blood, UA: NEGATIVE
Glucose, UA: NEGATIVE
Ketones, UA: NEGATIVE
Leukocytes, UA: NEGATIVE
Nitrite, UA: NEGATIVE
Protein, UA: NEGATIVE
Spec Grav, UA: 1.015 (ref 1.010–1.025)
Urobilinogen, UA: 0.2 E.U./dL
pH, UA: 6 (ref 5.0–8.0)

## 2017-07-23 NOTE — Patient Instructions (Signed)
Second Trimester of Pregnancy The second trimester is from week 13 through week 28, month 4 through 6. This is often the time in pregnancy that you feel your best. Often times, morning sickness has lessened or quit. You may have more energy, and you may get hungry more often. Your unborn baby (fetus) is growing rapidly. At the end of the sixth month, he or she is about 9 inches long and weighs about 1 pounds. You will likely feel the baby move (quickening) between 18 and 20 weeks of pregnancy. Follow these instructions at home:  Avoid all smoking, herbs, and alcohol. Avoid drugs not approved by your doctor.  Do not use any tobacco products, including cigarettes, chewing tobacco, and electronic cigarettes. If you need help quitting, ask your doctor. You may get counseling or other support to help you quit.  Only take medicine as told by your doctor. Some medicines are safe and some are not during pregnancy.  Exercise only as told by your doctor. Stop exercising if you start having cramps.  Eat regular, healthy meals.  Wear a good support bra if your breasts are tender.  Do not use hot tubs, steam rooms, or saunas.  Wear your seat belt when driving.  Avoid raw meat, uncooked cheese, and liter boxes and soil used by cats.  Take your prenatal vitamins.  Take 1500-2000 milligrams of calcium daily starting at the 20th week of pregnancy until you deliver your baby.  Try taking medicine that helps you poop (stool softener) as needed, and if your doctor approves. Eat more fiber by eating fresh fruit, vegetables, and whole grains. Drink enough fluids to keep your pee (urine) clear or pale yellow.  Take warm water baths (sitz baths) to soothe pain or discomfort caused by hemorrhoids. Use hemorrhoid cream if your doctor approves.  If you have puffy, bulging veins (varicose veins), wear support hose. Raise (elevate) your feet for 15 minutes, 3-4 times a day. Limit salt in your diet.  Avoid heavy  lifting, wear low heals, and sit up straight.  Rest with your legs raised if you have leg cramps or low back pain.  Visit your dentist if you have not gone during your pregnancy. Use a soft toothbrush to brush your teeth. Be gentle when you floss.  You can have sex (intercourse) unless your doctor tells you not to.  Go to your doctor visits. Get help if:  You feel dizzy.  You have mild cramps or pressure in your lower belly (abdomen).  You have a nagging pain in your belly area.  You continue to feel sick to your stomach (nauseous), throw up (vomit), or have watery poop (diarrhea).  You have bad smelling fluid coming from your vagina.  You have pain with peeing (urination). Get help right away if:  You have a fever.  You are leaking fluid from your vagina.  You have spotting or bleeding from your vagina.  You have severe belly cramping or pain.  You lose or gain weight rapidly.  You have trouble catching your breath and have chest pain.  You notice sudden or extreme puffiness (swelling) of your face, hands, ankles, feet, or legs.  You have not felt the baby move in over an hour.  You have severe headaches that do not go away with medicine.  You have vision changes. This information is not intended to replace advice given to you by your health care provider. Make sure you discuss any questions you have with your health care   provider. Document Released: 03/20/2009 Document Revised: 06/01/2015 Document Reviewed: 02/25/2012 Elsevier Interactive Patient Education  2017 Elsevier Inc.  

## 2017-07-23 NOTE — Progress Notes (Signed)
NEW OB HISTORY AND PHYSICAL  SUBJECTIVE:       Krystal Harrison is a 32 y.o. [phone] female, No LMP recorded. Patient is pregnant., Estimated Date of Delivery: 02/04/18, [redacted]w[redacted]d, presents today for establishment of Prenatal Care. She has no unusual complaints and complains of nausea with PNV      Gynecologic History No LMP recorded. Patient is pregnant. Normal Contraception: none Last Pap: 2017. Results were: normal  Obstetric History OB History  Gravida Para Term Preterm AB Living  6 4 4  0 1 3  SAB TAB Ectopic Multiple Live Births  1 0 0 0 3    # Outcome Date GA Lbr Len/2nd Weight Sex Delivery Anes PTL Lv  6 Current           5 Term 08/29/15 [redacted]w[redacted]d 06:41 / 00:02 8 lb 1.8 oz (3.68 kg) M Vag-Spont None  LIV  4 SAB 2016        FD  3 Term 09/12/11 [redacted]w[redacted]d  8 lb 12 oz (3.969 kg) F Vag-Spont  Y LIV  2 Term 12/31/07 [redacted]w[redacted]d  7 lb 14 oz (3.572 kg) M Vag-Spont  N LIV  1 Term 09/07/04 [redacted]w[redacted]d  7 lb 15 oz (3.6 kg) F Vag-Spont   LIV    Obstetric Comments  09/12/2011 pt leaking fluid at 32wks, hospitalized until delivery at 38wks.    Past Medical History:  Diagnosis Date  . Herpes genitalia    last outbreak 1-2 yrs ago    Past Surgical History:  Procedure Laterality Date  . EYE SURGERY    . EYE SURGERY Bilateral     Current Outpatient Medications on File Prior to Visit  Medication Sig Dispense Refill  . ondansetron (ZOFRAN ODT) 4 MG disintegrating tablet Take 1 tablet (4 mg total) by mouth every 8 (eight) hours as needed for nausea or vomiting. (Patient not taking: Reported on 07/07/2017) 20 tablet 0   No current facility-administered medications on file prior to visit.     No Known Allergies  Social History   Socioeconomic History  . Marital status: Single    Spouse name: Not on file  . Number of children: Not on file  . Years of education: Not on file  . Highest education level: Not on file  Occupational History  . Not on file  Social Needs  . Financial resource strain: Not on  file  . Food insecurity:    Worry: Not on file    Inability: Not on file  . Transportation needs:    Medical: Not on file    Non-medical: Not on file  Tobacco Use  . Smoking status: Never Smoker  . Smokeless tobacco: Never Used  Substance and Sexual Activity  . Alcohol use: No  . Drug use: No  . Sexual activity: Yes    Birth control/protection: None  Lifestyle  . Physical activity:    Days per week: Not on file    Minutes per session: Not on file  . Stress: Not on file  Relationships  . Social connections:    Talks on phone: Not on file    Gets together: Not on file    Attends religious service: Not on file    Active member of club or organization: Not on file    Attends meetings of clubs or organizations: Not on file    Relationship status: Not on file  . Intimate partner violence:    Fear of current or ex partner: Not on file    Emotionally  abused: Not on file    Physically abused: Not on file    Forced sexual activity: Not on file  Other Topics Concern  . Not on file  Social History Narrative  . Not on file    Family History  Problem Relation Age of Onset  . Diabetes Mother   . Anesthesia problems Neg Hx     The following portions of the patient's history were reviewed and updated as appropriate: allergies, current medications, past OB history, past medical history, past surgical history, past family history, past social history, and problem list.    OBJECTIVE: Initial Physical Exam (New OB)  GENERAL APPEARANCE: alert, well appearing, in no apparent distress, oriented to person, place and time HEAD: normocephalic, atraumatic MOUTH: mucous membranes moist, pharynx normal without lesions and dental hygiene good THYROID: no thyromegaly or masses present BREASTS: patient declined exam LUNGS: clear to auscultation, no wheezes, rales or rhonchi, symmetric air entry HEART: regular rate and rhythm, no murmurs ABDOMEN: soft, nontender, nondistended, no abnormal  masses, no epigastric pain, obese and fundus not palpable EXTREMITIES: no redness or tenderness in the calves or thighs SKIN: normal coloration and turgor, no rashes LYMPH NODES: no adenopathy palpable NEUROLOGIC: alert, oriented, normal speech, no focal findings or movement disorder noted  PELVIC EXAM not indicated  ASSESSMENT: Normal pregnancy  PLAN: Prenatal care See orders

## 2017-07-23 NOTE — Progress Notes (Signed)
NOB PE- pt states she is very tired, cant take prenatal vitamins due to "bad to taste in mouth"

## 2017-08-14 ENCOUNTER — Ambulatory Visit (INDEPENDENT_AMBULATORY_CARE_PROVIDER_SITE_OTHER): Payer: Medicaid Other | Admitting: Obstetrics and Gynecology

## 2017-08-14 VITALS — BP 109/68 | HR 98 | Ht 65.0 in | Wt 205.5 lb

## 2017-08-14 DIAGNOSIS — Z3482 Encounter for supervision of other normal pregnancy, second trimester: Secondary | ICD-10-CM

## 2017-08-14 DIAGNOSIS — Z23 Encounter for immunization: Secondary | ICD-10-CM | POA: Diagnosis not present

## 2017-08-14 LAB — POCT URINALYSIS DIPSTICK
Bilirubin, UA: NEGATIVE
Blood, UA: NEGATIVE
Glucose, UA: NEGATIVE
Ketones, UA: NEGATIVE
Leukocytes, UA: NEGATIVE
Nitrite, UA: NEGATIVE
Protein, UA: POSITIVE — AB
Spec Grav, UA: 1.02 (ref 1.010–1.025)
Urobilinogen, UA: 0.2 E.U./dL
pH, UA: 6 (ref 5.0–8.0)

## 2017-08-14 MED ORDER — TETANUS-DIPHTH-ACELL PERTUSSIS 5-2.5-18.5 LF-MCG/0.5 IM SUSP
0.5000 mL | Freq: Once | INTRAMUSCULAR | Status: AC
  Administered 2017-08-14: 0.5 mL via INTRAMUSCULAR

## 2017-08-14 NOTE — Progress Notes (Signed)
Pt was seen in the office today due to stepping on a nail while putting together her son's bed. Pt had nail scratch marks on her left second toe and nail pucher mark on her right foot. tdap shot was given.   BP 109/68   Pulse 98   Ht 5\' 5"  (1.651 m)   Wt 205 lb 8 oz (93.2 kg)   BMI 34.20 kg/m

## 2017-08-21 ENCOUNTER — Ambulatory Visit (INDEPENDENT_AMBULATORY_CARE_PROVIDER_SITE_OTHER): Payer: Medicaid Other | Admitting: Certified Nurse Midwife

## 2017-08-21 VITALS — BP 105/68 | HR 83 | Wt 205.3 lb

## 2017-08-21 DIAGNOSIS — Z833 Family history of diabetes mellitus: Secondary | ICD-10-CM

## 2017-08-21 DIAGNOSIS — Z3482 Encounter for supervision of other normal pregnancy, second trimester: Secondary | ICD-10-CM

## 2017-08-21 LAB — POCT URINALYSIS DIPSTICK
Bilirubin, UA: NEGATIVE
Blood, UA: NEGATIVE
Glucose, UA: NEGATIVE
Ketones, UA: NEGATIVE
Leukocytes, UA: NEGATIVE
Nitrite, UA: NEGATIVE
Protein, UA: NEGATIVE
Spec Grav, UA: 1.02 (ref 1.010–1.025)
Urobilinogen, UA: 0.2 E.U./dL
pH, UA: 6 (ref 5.0–8.0)

## 2017-08-21 NOTE — Patient Instructions (Signed)
WHAT OB PATIENTS CAN EXPECT   Confirmation of pregnancy and ultrasound ordered if medically indicated-[redacted] weeks gestation  New OB (NOB) intake with nurse and New OB (NOB) labs- [redacted] weeks gestation  New OB (NOB) physical examination with provider- 11/[redacted] weeks gestation  Flu vaccine-[redacted] weeks gestation  Anatomy scan-[redacted] weeks gestation  Glucose tolerance test, blood work to test for anemia, T-dap vaccine-[redacted] weeks gestation  Vaginal swabs/cultures-STD/Group B strep-[redacted] weeks gestation  Appointments every 4 weeks until 28 weeks  Every 2 weeks from 28 weeks until 36 weeks  Weekly visits from 36 weeks until delivery  Common Medications Safe in Pregnancy  Acne:      Constipation:  Benzoyl Peroxide     Colace  Clindamycin      Dulcolax Suppository  Topica Erythromycin     Fibercon  Salicylic Acid      Metamucil         Miralax AVOID:        Senakot   Accutane    Cough:  Retin-A       Cough Drops  Tetracycline      Phenergan w/ Codeine if Rx  Minocycline      Robitussin (Plain & DM)  Antibiotics:     Crabs/Lice:  Ceclor       RID  Cephalosporins    AVOID:  E-Mycins      Kwell  Keflex  Macrobid/Macrodantin   Diarrhea:  Penicillin      Kao-Pectate  Zithromax      Imodium AD         PUSH FLUIDS AVOID:       Cipro     Fever:  Tetracycline      Tylenol (Regular or Extra  Minocycline       Strength)  Levaquin      Extra Strength-Do not          Exceed 8 tabs/24 hrs Caffeine:        <253m/day (equiv. To 1 cup of coffee or  approx. 3 12 oz sodas)         Gas: Cold/Hayfever:       Gas-X  Benadryl      Mylicon  Claritin       Phazyme  **Claritin-D        Chlor-Trimeton    Headaches:  Dimetapp      ASA-Free Excedrin  Drixoral-Non-Drowsy     Cold Compress  Mucinex (Guaifenasin)     Tylenol (Regular or Extra  Sudafed/Sudafed-12 Hour     Strength)  **Sudafed PE Pseudoephedrine   Tylenol Cold & Sinus     Vicks Vapor Rub  Zyrtec  **AVOID if Problems With Blood  Pressure         Heartburn: Avoid lying down for at least 1 hour after meals  Aciphex      Maalox     Rash:  Milk of Magnesia     Benadryl    Mylanta       1% Hydrocortisone Cream  Pepcid  Pepcid Complete   Sleep Aids:  Prevacid      Ambien   Prilosec       Benadryl  Rolaids       Chamomile Tea  Tums (Limit 4/day)     Unisom  Zantac       Tylenol PM         Warm milk-add vanilla or  Hemorrhoids:       Sugar for taste  Anusol/Anusol H.C.  (RX: Analapram 2.5%)  Sugar Substitutes:  Hydrocortisone OTC     Ok in moderation  Preparation H      Tucks        Vaseline lotion applied to tissue with wiping    Herpes:     Throat:  Acyclovir      Oragel  Famvir  Valtrex     Vaccines:         Flu Shot Leg Cramps:       *Gardasil  Benadryl      Hepatitis A         Hepatitis B Nasal Spray:       Pneumovax  Saline Nasal Spray     Polio Booster         Tetanus Nausea:       Tuberculosis test or PPD  Vitamin B6 25 mg TID   AVOID:    Dramamine      *Gardasil  Emetrol       Live Poliovirus  Ginger Root 250 mg QID    MMR (measles, mumps &  High Complex Carbs @ Bedtime    rebella)  Sea Bands-Accupressure    Varicella (Chickenpox)  Unisom 1/2 tab TID     *No known complications           If received before Pain:         Known pregnancy;   Darvocet       Resume series after  Lortab        Delivery  Percocet    Yeast:   Tramadol      Femstat  Tylenol 3      Gyne-lotrimin  Ultram       Monistat  Vicodin           MISC:         All Sunscreens           Hair Coloring/highlights          Insect Repellant's          (Including DEET)         Mystic Tans Back Pain in Pregnancy Back pain during pregnancy is common. Back pain may be caused by several factors that are related to changes during your pregnancy. Follow these instructions at home: Managing pain, stiffness, and swelling  If directed, apply ice for sudden (acute) back pain. ? Put ice in a plastic bag. ? Place a towel  between your skin and the bag. ? Leave the ice on for 20 minutes, 2-3 times per day.  If directed, apply heat to the affected area before you exercise: ? Place a towel between your skin and the heat pack or heating pad. ? Leave the heat on for 20-30 minutes. ? Remove the heat if your skin turns bright red. This is especially important if you are unable to feel pain, heat, or cold. You may have a greater risk of getting burned. Activity  Exercise as told by your health care provider. Exercising is the best way to prevent or manage back pain.  Listen to your body when lifting. If lifting hurts, ask for help or bend your knees. This uses your leg muscles instead of your back muscles.  Squat down when picking up something from the floor. Do not bend over.  Only use bed rest as told by your health care provider. Bed rest should only be used for the most severe episodes of back pain. Standing, Sitting, and Lying Down  Do not stand in one place for   long periods of time.  Use good posture when sitting. Make sure your head rests over your shoulders and is not hanging forward. Use a pillow on your lower back if necessary.  Try sleeping on your side, preferably the left side, with a pillow or two between your legs. If you are sore after a night's rest, your bed may be too soft. A firm mattress may provide more support for your back during pregnancy. General instructions  Do not wear high heels.  Eat a healthy diet. Try to gain weight within your health care provider's recommendations.  Use a maternity girdle, elastic sling, or back brace as told by your health care provider.  Take over-the-counter and prescription medicines only as told by your health care provider.  Keep all follow-up visits as told by your health care provider. This is important. This includes any visits with any specialists, such as a physical therapist. Contact a health care provider if:  Your back pain interferes with  your daily activities.  You have increasing pain in other parts of your body. Get help right away if:  You develop numbness, tingling, weakness, or problems with the use of your arms or legs.  You develop severe back pain that is not controlled with medicine.  You have a sudden change in bowel or bladder control.  You develop shortness of breath, dizziness, or you faint.  You develop nausea, vomiting, or sweating.  You have back pain that is a rhythmic, cramping pain similar to labor pains. Labor pain is usually 1-2 minutes apart, lasts for about 1 minute, and involves a bearing down feeling or pressure in your pelvis.  You have back pain and your water breaks or you have vaginal bleeding.  You have back pain or numbness that travels down your leg.  Your back pain developed after you fell.  You develop pain on one side of your back.  You see blood in your urine.  You develop skin blisters in the area of your back pain. This information is not intended to replace advice given to you by your health care provider. Make sure you discuss any questions you have with your health care provider. Document Released: 04/03/2005 Document Revised: 06/01/2015 Document Reviewed: 09/07/2014 Elsevier Interactive Patient Education  2018 Elsevier Inc. Round Ligament Pain The round ligament is a cord of muscle and tissue that helps to support the uterus. It can become a source of pain during pregnancy if it becomes stretched or twisted as the baby grows. The pain usually begins in the second trimester of pregnancy, and it can come and go until the baby is delivered. It is not a serious problem, and it does not cause harm to the baby. Round ligament pain is usually a short, sharp, and pinching pain, but it can also be a dull, lingering, and aching pain. The pain is felt in the lower side of the abdomen or in the groin. It usually starts deep in the groin and moves up to the outside of the hip area. Pain  can occur with:  A sudden change in position.  Rolling over in bed.  Coughing or sneezing.  Physical activity.  Follow these instructions at home: Watch your condition for any changes. Take these steps to help with your pain:  When the pain starts, relax. Then try: ? Sitting down. ? Flexing your knees up to your abdomen. ? Lying on your side with one pillow under your abdomen and another pillow between your legs. ?   Sitting in a warm bath for 15-20 minutes or until the pain goes away.  Take over-the-counter and prescription medicines only as told by your health care provider.  Move slowly when you sit and stand.  Avoid long walks if they cause pain.  Stop or lessen your physical activities if they cause pain.  Contact a health care provider if:  Your pain does not go away with treatment.  You feel pain in your back that you did not have before.  Your medicine is not helping. Get help right away if:  You develop a fever or chills.  You develop uterine contractions.  You develop vaginal bleeding.  You develop nausea or vomiting.  You develop diarrhea.  You have pain when you urinate. This information is not intended to replace advice given to you by your health care provider. Make sure you discuss any questions you have with your health care provider. Document Released: 10/03/2007 Document Revised: 06/01/2015 Document Reviewed: 03/02/2014 Elsevier Interactive Patient Education  2018 Elsevier Inc.  

## 2017-08-21 NOTE — Progress Notes (Signed)
ROB- doing well. Endorses family history of diabetes, plan early glucola next visit. Taking prenatal gummies. Reviewed red flag signs and symptoms and when to call.  RTC x 4 weeks for Early Glucola, Anatomy Scan & ROB or sooner if needed.   Miguel Rota, Nurse Practitioner [place]

## 2017-08-21 NOTE — Progress Notes (Signed)
Pt is here for an [place] visit.

## 2017-08-21 NOTE — Progress Notes (Signed)
I have seen, interviewed, and examined the patient in conjunction with the [facility] Nurse Practitioner student and affirm the diagnosis and management plan.   Diona Fanti, CNM [facility]

## 2017-09-05 ENCOUNTER — Encounter: Payer: Self-pay | Admitting: Emergency Medicine

## 2017-09-05 ENCOUNTER — Emergency Department
Admission: EM | Admit: 2017-09-05 | Discharge: 2017-09-05 | Disposition: A | Discharge status: Home / Self Care | Payer: Medicaid Other | Attending: Emergency Medicine | Admitting: Emergency Medicine

## 2017-09-05 ENCOUNTER — Other Ambulatory Visit: Payer: Self-pay

## 2017-09-05 DIAGNOSIS — K0889 Other specified disorders of teeth and supporting structures: Secondary | ICD-10-CM | POA: Diagnosis not present

## 2017-09-05 MED ORDER — LIDOCAINE VISCOUS HCL 2 % MT SOLN: OROMUCOSAL | 0 refills | Status: DC

## 2017-09-05 NOTE — Discharge Instructions (Addendum)
Keep your appointment with the oral surgeon on September 9.  You may take Tylenol sparingly due to your pregnancy as needed for pain.  Lidocaine viscus to a cottonball and then to your tooth every 4 6 hours as needed for pain.  Soft foods.  Continue taking amoxicillin until completely finished.

## 2017-09-05 NOTE — ED Triage Notes (Addendum)
Pain to right lower tooth since yesterday.  Reports her dentist tried to pull but had trouble numbing her and she cannot see oral surgeon until sept 9 and needs something for pain. Pt is 18 or [redacted] weeks pregnant.

## 2017-09-05 NOTE — ED Provider Notes (Signed)
[facility] Emergency Department Provider Note  ____________________________________________   First MD Initiated Contact with Patient 09/05/17 1739     (approximate)  I have reviewed the triage vital signs and the nursing notes.   HISTORY  Chief Complaint Dental Pain   HPI Krystal Harrison is a 32 y.o. female sent to the emergency department with complaint of right lower tooth pain since yesterday.  Patient states that she was at a dentist office who was trying to pull her tooth but was unsuccessful.  She is now being sent to an oral surgeon and has an appointment for September 9.  Patient states she has been taking Tylenol without any relief of her pain.  Patient currently is taking amoxicillin.  She is 18 to [redacted] weeks pregnant.  She rates her pain as 5/10.   Past Medical History:  Diagnosis Date  . Herpes genitalia    last outbreak 1-2 yrs ago    Patient Active Problem List   Diagnosis Date Noted  . Vitamin D deficiency 10/12/2015  . Increased BMI 07/19/2015  . 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 Rubella non-immune status, antepartum 02/22/2015  . Chronic constipation 01/31/2015    Past Surgical History:  Procedure Laterality Date  . EYE SURGERY    . EYE SURGERY Bilateral     Prior to Admission medications   Medication Sig Start Date End Date Taking? Authorizing Provider  lidocaine (XYLOCAINE) 2 % solution Apply to cottonball and then applied to gum surrounding the affected tooth every 4 to 6 hours as needed pain. 09/05/17   Johnn Hai, PA-C  Prenatal Vit-Fe Fumarate-FA (PRENATAL MULTIVITAMIN) TABS tablet Take 1 tablet by mouth daily at 12 noon.    [provider]    Allergies Patient has no known allergies.  Family History  Problem Relation Age of Onset  . Diabetes Mother   . Anesthesia problems Neg Hx     Social History Social History   Tobacco Use  . Smoking status: Never Smoker  . Smokeless tobacco: Never Used    Substance Use Topics  . Alcohol use: No  . Drug use: No    Review of Systems Constitutional: No fever/chills Eyes: No visual changes. ENT: Positive for dental pain. Cardiovascular: Denies chest pain. Respiratory: Denies shortness of breath. Genitourinary: Positive pregnancy. Musculoskeletal: Negative for back pain. Neurological: Negative for  focal weakness or numbness. ___________________________________________   PHYSICAL EXAM:  VITAL SIGNS: ED Triage Vitals  Enc Vitals Group     BP 09/05/17 1721 122/61     Pulse Rate 09/05/17 1721 93     Resp 09/05/17 1721 18     Temp 09/05/17 1721 98.5 F (36.9 C)     Temp Source 09/05/17 1721 Oral     SpO2 09/05/17 1721 98 %     Weight 09/05/17 1722 205 lb (93 kg)     Height --      Head Circumference --      Peak Flow --      Pain Score 09/05/17 1722 5     Pain Loc --      Pain Edu? --      Excl. in [place]? --    Constitutional: Alert and oriented. Well appearing and in no acute distress. Eyes: Conjunctivae are normal.  Head: Atraumatic. Nose: No congestion/rhinnorhea. Mouth/Throat: Mucous membranes are moist.  Oropharynx non-erythematous.  Right lower posterior molar in poor repair.  Tissue surrounding is edematous without evidence of abscess or drainage. Neck: No stridor.  Hematological/Lymphatic/Immunilogical: No cervical lymphadenopathy. Cardiovascular: Normal rate, regular rhythm. Grossly normal heart sounds.  Good peripheral circulation. Respiratory: Normal respiratory effort.  No retractions. Lungs CTAB. Gastrointestinal: Pregnant abdomen. Musculoskeletal: Moves upper and lower extremities without any difficulty.  No gait was noted. Neurologic:  Normal speech and language. No gross focal neurologic deficits are appreciated.  Skin:  Skin is warm, dry and intact.  Psychiatric: Mood and affect are normal. Speech and behavior are normal.  ____________________________________________   LABS (all labs ordered are listed,  but only abnormal results are displayed)  Labs Reviewed - No data to display   PROCEDURES  Procedure(s) performed: None  Procedures  Critical Care performed: No  ____________________________________________   INITIAL IMPRESSION / ASSESSMENT AND PLAN / ED COURSE  As part of my medical decision making, I reviewed the following data within the electronic MEDICAL RECORD NUMBER Notes from prior ED visits and [place] Controlled Substance Database  Patient presents with dental pain and 18 to [redacted] weeks pregnant.  Patient is aware that she cannot take narcotics.  Currently she is already taking amoxicillin from her dentist.  She has an appointment with an oral surgeon on September 9.  Patient was given a prescription for viscous lidocaine to apply to a cottonball and then to her affected tooth.  She is aware that she should take Tylenol sparingly and that anti-inflammatories are contraindicated with her pregnancy.  ____________________________________________   FINAL CLINICAL IMPRESSION(S) / ED DIAGNOSES  Final diagnoses:  Pain, dental     ED Discharge Orders         Ordered    lidocaine (XYLOCAINE) 2 % solution     09/05/17 1750           Note:  This document was prepared using Dragon voice recognition software and may include unintentional dictation errors.    Johnn Hai, PA-C 09/05/17 Duanne Moron, MD 09/07/17 [phone]

## 2017-09-09 ENCOUNTER — Other Ambulatory Visit: Payer: Self-pay | Admitting: Certified Nurse Midwife

## 2017-09-09 DIAGNOSIS — Z3689 Encounter for other specified antenatal screening: Secondary | ICD-10-CM

## 2017-09-10 ENCOUNTER — Emergency Department
Admission: EM | Admit: 2017-09-10 | Discharge: 2017-09-10 | Disposition: A | Discharge status: Home / Self Care | Payer: Medicaid Other | Attending: Emergency Medicine | Admitting: Emergency Medicine

## 2017-09-10 ENCOUNTER — Telehealth: Payer: Self-pay

## 2017-09-10 ENCOUNTER — Telehealth: Payer: Self-pay | Admitting: Certified Nurse Midwife

## 2017-09-10 ENCOUNTER — Encounter: Payer: Self-pay | Admitting: Emergency Medicine

## 2017-09-10 ENCOUNTER — Emergency Department: Payer: Medicaid Other

## 2017-09-10 DIAGNOSIS — O4690 Antepartum hemorrhage, unspecified, unspecified trimester: Secondary | ICD-10-CM

## 2017-09-10 DIAGNOSIS — Z3A19 19 weeks gestation of pregnancy: Secondary | ICD-10-CM | POA: Insufficient documentation

## 2017-09-10 DIAGNOSIS — O9989 Other specified diseases and conditions complicating pregnancy, childbirth and the puerperium: Secondary | ICD-10-CM | POA: Diagnosis not present

## 2017-09-10 DIAGNOSIS — R82998 Other abnormal findings in urine: Secondary | ICD-10-CM | POA: Diagnosis not present

## 2017-09-10 DIAGNOSIS — O209 Hemorrhage in early pregnancy, unspecified: Secondary | ICD-10-CM | POA: Diagnosis present

## 2017-09-10 LAB — URINALYSIS, COMPLETE (UACMP) WITH MICROSCOPIC
Bilirubin Urine: NEGATIVE
Glucose, UA: NEGATIVE mg/dL
Ketones, ur: NEGATIVE mg/dL
Nitrite: NEGATIVE
Protein, ur: NEGATIVE mg/dL
Specific Gravity, Urine: 1.024 (ref 1.005–1.030)
pH: 6 (ref 5.0–8.0)

## 2017-09-10 LAB — HCG, QUANTITATIVE, PREGNANCY: hCG, Beta Chain, Quant, S: 11328 m[IU]/mL — ABNORMAL HIGH (ref <5)

## 2017-09-10 NOTE — ED Notes (Signed)
First Nurse Note: Patient to [place] via [place].

## 2017-09-10 NOTE — ED Notes (Signed)
First Nurse Note: Patient states she is [redacted] weeks pregnant and having bleeding which she noticed after using rest room, states she was cramping some last PM and now has back pain. Patient of [facility].  NAD, has daughter with her who is also being checked in.

## 2017-09-10 NOTE — Telephone Encounter (Signed)
Called the pt after 2 mychart messages went unread. Pt states " I don't even know if I remember my login" when asked - however she answered the message I sent at 8:43 at 10:20. She states she went to the hospital " because I didn't get a call back". States she is waiting on urine culture results done at hosp. Asked pt to please call us in the AM and let us know how she is doing.

## 2017-09-10 NOTE — Telephone Encounter (Signed)
The patient called in at 8:11 this morning stating she was having pink bleeding/discharge, and she is [redacted] weeks pregnant.  She is also having some cramping.  She is awaiting a call back.  Her call back number is [phone], message sent back as high priority, please advise, thanks.

## 2017-09-10 NOTE — ED Provider Notes (Signed)
[facility] Emergency Department Provider Note  ____________________________________________   First MD Initiated Contact with Patient 09/10/17 1145     (approximate)  I have reviewed the triage vital signs and the nursing notes.   HISTORY  Chief Complaint Vaginal Bleeding and Abdominal Cramping   HPI Krystal Harrison is a 32 y.o. female resents to the emergency department with complaint of vaginal bleeding that started this morning.  Patient currently is [redacted] weeks pregnant.  She denies any problems with this pregnancy thus far.  She states that she did have some hemorrhaging with her last pregnancy that "fixed itself at 20 weeks".  She currently is a patient at [facility] but did not call today.  She states that she is not using a pad but only sees blood when she wipes.  She denies any other symptoms at this time such as cramping or abdominal pain, nausea, vomiting, fever, chills or any urinary symptoms.  She currently is taking amoxicillin for a dental abscess that she was seen last week for.  She rates her pain as 6 out of 10.  Past Medical History:  Diagnosis Date  . Herpes genitalia    last outbreak 1-2 yrs ago    Patient Active Problem List   Diagnosis Date Noted  . Vitamin D deficiency 10/12/2015  . Increased BMI 07/19/2015  . 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 Rubella non-immune status, antepartum 02/22/2015  . Chronic constipation 01/31/2015    Past Surgical History:  Procedure Laterality Date  . EYE SURGERY    . EYE SURGERY Bilateral     Prior to Admission medications   Medication Sig Start Date End Date Taking? Authorizing Provider  lidocaine (XYLOCAINE) 2 % solution Apply to cottonball and then applied to gum surrounding the affected tooth every 4 to 6 hours as needed pain. 09/05/17   Johnn Hai, PA-C  Prenatal Vit-Fe Fumarate-FA (PRENATAL MULTIVITAMIN) TABS tablet Take 1 tablet by mouth daily at 12 noon.    [provider]    Allergies Patient has no known allergies.  Family History  Problem Relation Age of Onset  . Diabetes Mother   . Anesthesia problems Neg Hx     Social History Social History   Tobacco Use  . Smoking status: Never Smoker  . Smokeless tobacco: Never Used  Substance Use Topics  . Alcohol use: No  . Drug use: No    Review of Systems Constitutional: No fever/chills Eyes: No visual changes. ENT: No sore throat. Cardiovascular: Denies chest pain. Respiratory: Denies shortness of breath. Gastrointestinal: No abdominal pain.  No nausea, no vomiting.  No diarrhea.  No constipation. Genitourinary: Negative for dysuria.  Positive for spotting.  Positive for pregnancy. Musculoskeletal: Negative for back pain. Skin: Negative for rash. Neurological: Negative for headaches, focal weakness or numbness. ____________________________________________   PHYSICAL EXAM:  VITAL SIGNS: ED Triage Vitals [09/10/17 0847]  Enc Vitals Group     BP 115/69     Pulse Rate 82     Resp 20     Temp 98.4 F (36.9 C)     Temp Source Oral     SpO2 100 %     Weight 205 lb (93 kg)     Height 5\' 5"  (1.651 m)     Head Circumference      Peak Flow      Pain Score 6     Pain Loc      Pain Edu?  Excl. in [place]?    Constitutional: Alert and oriented. Well appearing and in no acute distress. Eyes: Conjunctivae are normal.  Head: Atraumatic. Nose: No congestion/rhinnorhea. Neck: No stridor.   Cardiovascular: Normal rate, regular rhythm. Grossly normal heart sounds.  Good peripheral circulation. Respiratory: Normal respiratory effort.  No retractions. Lungs CTAB. Gastrointestinal: Soft and nontender.  Pregnant abdomen.  Bowel sounds normoactive x4 quadrants.  No CVA tenderness. Musculoskeletal: Moves upper and lower extremities without any difficulty.  No edema is noted lower extremities. Neurologic:  Normal speech and language. No gross focal neurologic deficits are appreciated.    Skin:  Skin is warm, dry and intact. No rash noted. Psychiatric: Mood and affect are normal. Speech and behavior are normal.  ____________________________________________   LABS (all labs ordered are listed, but only abnormal results are displayed)  Labs Reviewed  URINALYSIS, COMPLETE (UACMP) WITH MICROSCOPIC - Abnormal; Notable for the following components:      Result Value   Color, Urine YELLOW (*)    APPearance CLOUDY (*)    Hgb urine dipstick MODERATE (*)    Leukocytes, UA MODERATE (*)    Bacteria, UA MANY (*)    All other components within normal limits  HCG, QUANTITATIVE, PREGNANCY - Abnormal; Notable for the following components:   hCG, Beta Chain, Quant, S 11,328 (*)    All other components within normal limits  URINE CULTURE   RADIOLOGY  Official radiology report(s): US Ob Limited  Result Date: 09/10/2017 CLINICAL DATA:  Abdominal pain and cramping. Vaginal bleeding. [redacted] weeks gestational age. EXAM: LIMITED OBSTETRIC ULTRASOUND FINDINGS: Number of Fetuses: 1 Heart Rate:  120 bpm Movement: Yes Presentation: Transverse with head to maternal left Placental Location: Anterior Previa: No Amniotic Fluid (Subjective):  Within normal limits. BPD: 4.3 cm 19 w  0 d MATERNAL FINDINGS: Cervix:  Appears closed.  Length measures 4.6 cm transabdominally. Uterus/Adnexae: No abnormality visualized. No evidence of placental abruption. IMPRESSION: Single living intrauterine fetus. No placental abruption, previa, or other acute maternal findings identified. This exam is performed on an emergent basis and does not comprehensively evaluate fetal size, dating, or anatomy; follow-up complete OB US should be considered if further fetal assessment is warranted. Electronically Signed   By: Earle Gell M.D.   On: 09/10/2017 11:26  ____________________________________________   PROCEDURES  Procedure(s) performed: None  Procedures  Critical Care performed:  No  ____________________________________________   INITIAL IMPRESSION / ASSESSMENT AND PLAN / ED COURSE  As part of my medical decision making, I reviewed the following data within the electronic MEDICAL RECORD NUMBER Notes from prior ED visits and [place] Controlled Substance Database  Patient presents to the emergency department with pink-colored blood tinged toilet paper when wiping.  This has not been enough for her to wear any pad.  Patient currently is [redacted] weeks pregnant and ultrasound confirms that there is a single IUP present without any evidence of complications.  Patient was reassured.  She is to follow-up with [facility].  Urinalysis does reveal WBCs and bacteria.  Patient is asymptomatic and currently taking amoxicillin for a dental problem.  Culture was ordered and patient was encouraged to follow-up with her OB/GYN.  Patient is to return to the emergency department if any severe worsening of her symptoms, nausea, vomiting, fever or chills.  ____________________________________________   FINAL CLINICAL IMPRESSION(S) / ED DIAGNOSES  Final diagnoses:  Vaginal bleeding in pregnancy     ED Discharge Orders    None       Note:  This document was prepared using Dragon voice recognition software and may include unintentional dictation errors.    Johnn Hai, PA-C 09/10/17 [place], [place], MD 09/12/17 [phone]

## 2017-09-10 NOTE — Telephone Encounter (Signed)
Second attempt to contact patient-  First time sent 8:43 mychart message call came in at 8:24. Second attempt call patient- no answer and mailbox is full.

## 2017-09-10 NOTE — ED Triage Notes (Signed)
Pt reports is [redacted] weeks pregnant and started bleeding and cramping this am. Pt reports the blood is light pinkish color. Pt denies blood clots. Pt denies complications with this pregnancy but reports last pregnancy she had a hemorrhage that fixed it self at 20 some weeks and a miscarriage prior to that one. Pt reports pain to abdomen intermittently and lower back.

## 2017-09-10 NOTE — Discharge Instructions (Signed)
Follow-up with your OB/GYN at [facility] if any continued problems.  A culture was ordered for your urine.  Continue taking amoxicillin until completely finished.  Increase fluids.  Return to the emergency department if any severe worsening of your symptoms.

## 2017-09-10 NOTE — ED Notes (Signed)
Pt states that she started having lower back pain and sharp pain in lower abd this am - she reports that this am she started spotting blood (blood pink in color alternating with brown) - denies passing clots

## 2017-09-11 LAB — URINE CULTURE
Culture: NO GROWTH
Special Requests: NORMAL

## 2017-09-17 ENCOUNTER — Ambulatory Visit (INDEPENDENT_AMBULATORY_CARE_PROVIDER_SITE_OTHER): Payer: Medicaid Other | Admitting: Certified Nurse Midwife

## 2017-09-17 ENCOUNTER — Ambulatory Visit (INDEPENDENT_AMBULATORY_CARE_PROVIDER_SITE_OTHER): Payer: Medicaid Other

## 2017-09-17 ENCOUNTER — Other Ambulatory Visit: Payer: Medicaid Other

## 2017-09-17 VITALS — BP 114/67 | HR 90 | Wt 213.2 lb

## 2017-09-17 DIAGNOSIS — Z3689 Encounter for other specified antenatal screening: Secondary | ICD-10-CM

## 2017-09-17 DIAGNOSIS — Z363 Encounter for antenatal screening for malformations: Secondary | ICD-10-CM

## 2017-09-17 DIAGNOSIS — Z3482 Encounter for supervision of other normal pregnancy, second trimester: Secondary | ICD-10-CM

## 2017-09-17 LAB — POCT URINALYSIS DIPSTICK OB
Bilirubin, UA: NEGATIVE
Blood, UA: NEGATIVE
Glucose, UA: NEGATIVE
Ketones, UA: NEGATIVE
Leukocytes, UA: NEGATIVE
Nitrite, UA: NEGATIVE
Spec Grav, UA: 1.02 (ref 1.010–1.025)
Urobilinogen, UA: 0.2 E.U./dL
pH, UA: 6 (ref 5.0–8.0)

## 2017-09-17 NOTE — Progress Notes (Signed)
Krystal Harrison, doing well. Pt states she has a tooth that needs to be removed and the oral surgeon was not able to do it with local. They are unwilling to do it under sedation until she has delivered. She is asking about tylenol with codeine. She asked if we would prescribe it for her. Instructed that she would need to have that filled by her dentist. Reviewed risks of necrotics in pregnancy and encouraged to take it sparingly. She verbalizes understanding and agrees to plan of care. Follow up for u/s in 2 wks then 4 wks for Krystal Harrison.   Philip Aspen, CNM    ULTRASOUND REPORT  Location: [facility] Date of Service:  09/17/2017  Indications: Anatomy Findings:  Singleton intrauterine pregnancy is visualized with FHR at 153 BPM. Biometrics give an (U/S) Gestational age of 72 1/7 weeks and an (U/S) EDD of 02/03/18; this correlates with the clinically established EDD of 02/04/18.  Fetal presentation is breech.  EFW: 344 grams (0lb 12oz). Placenta: Anterior and grade 2. AFI: WNL subjectively.  Anatomic survey is incomplete, however, visualized anatomy appears WNL.  Views of the fetal profile are needed to complete survey. Gender - Female.   Right Ovary measures 2.8 x 2.3 x 1.4 cm. It is normal in appearance. Left Ovary measures 2.2 x 1.3 x 1.1 cm. It is normal appearance. There is no obvious evidence of a corpus luteal cyst. Survey of the adnexa demonstrates no adnexal masses. There is no free peritoneal fluid in the cul de sac.  Impression: 1. 20 1/7 week Viable Singleton Intrauterine pregnancy by U/S. 2. (U/S) EDD is consistent with Clinically established (LMP) EDD of 02/04/18. 3. Incomplete anatomy scan - Need views of the fetal profile.  Recommendations: 1.Clinical correlation with the patient's History and Physical Exam. 2. F/U U/S to complete anatomical survey  Dario Ave, RDMS

## 2017-09-17 NOTE — Patient Instructions (Signed)

## 2017-09-18 LAB — GLUCOSE, 1 HOUR GESTATIONAL: Gestational Diabetes Screen: 63 mg/dL — ABNORMAL LOW (ref 65–139)

## 2017-09-19 ENCOUNTER — Other Ambulatory Visit: Payer: Medicaid Other

## 2017-09-19 ENCOUNTER — Encounter: Payer: Medicaid Other | Admitting: Certified Nurse Midwife

## 2017-10-02 ENCOUNTER — Encounter: Payer: Self-pay | Admitting: *Deleted

## 2017-10-02 ENCOUNTER — Observation Stay
Admission: EM | Admit: 2017-10-02 | Discharge: 2017-10-03 | Disposition: A | Discharge status: Home / Self Care | Payer: Medicaid Other | Source: Ambulatory Visit | Attending: Certified Nurse Midwife | Admitting: Certified Nurse Midwife

## 2017-10-02 ENCOUNTER — Other Ambulatory Visit: Payer: Self-pay

## 2017-10-02 ENCOUNTER — Observation Stay: Payer: Medicaid Other

## 2017-10-02 DIAGNOSIS — N939 Abnormal uterine and vaginal bleeding, unspecified: Secondary | ICD-10-CM

## 2017-10-02 DIAGNOSIS — Z3A22 22 weeks gestation of pregnancy: Secondary | ICD-10-CM

## 2017-10-02 DIAGNOSIS — O26852 Spotting complicating pregnancy, second trimester: Secondary | ICD-10-CM | POA: Diagnosis not present

## 2017-10-02 DIAGNOSIS — O4692 Antepartum hemorrhage, unspecified, second trimester: Secondary | ICD-10-CM | POA: Diagnosis present

## 2017-10-02 LAB — URINALYSIS, ROUTINE W REFLEX MICROSCOPIC
Bilirubin Urine: NEGATIVE
Glucose, UA: NEGATIVE mg/dL
Ketones, ur: NEGATIVE mg/dL
Nitrite: NEGATIVE
Protein, ur: NEGATIVE mg/dL
Specific Gravity, Urine: 1.016 (ref 1.005–1.030)
pH: 6 (ref 5.0–8.0)

## 2017-10-02 LAB — KLEIHAUER-BETKE STAIN
Fetal Cells %: 0 %
Quantitation Fetal Hemoglobin: 0 mL

## 2017-10-02 LAB — FETAL FIBRONECTIN: Fetal Fibronectin: NEGATIVE

## 2017-10-02 LAB — FIBRINOGEN: Fibrinogen: 482 mg/dL — ABNORMAL HIGH (ref 210–475)

## 2017-10-02 MED ORDER — ACETAMINOPHEN 325 MG PO TABS
ORAL_TABLET | ORAL | Status: AC
  Filled 2017-10-02: qty 2

## 2017-10-02 MED ORDER — ACETAMINOPHEN 325 MG PO TABS
650.0000 mg | ORAL_TABLET | Freq: Four times a day (QID) | ORAL | Status: DC | PRN
  Administered 2017-10-02 – 2017-10-03 (×3): 650 mg via ORAL
  Filled 2017-10-02 (×2): qty 2

## 2017-10-02 MED ORDER — ONDANSETRON HCL 4 MG/2ML IJ SOLN: 4.0000 mg | Freq: Four times a day (QID) | INTRAMUSCULAR | Status: DC | PRN

## 2017-10-02 MED ORDER — ACETAMINOPHEN 500 MG PO TABS
1000.0000 mg | ORAL_TABLET | Freq: Once | ORAL | Status: AC | PRN
  Administered 2017-10-02: 1000 mg via ORAL
  Filled 2017-10-02: qty 2

## 2017-10-02 MED ORDER — SOD CITRATE-CITRIC ACID 500-334 MG/5ML PO SOLN: 30.0000 mL | ORAL | Status: DC | PRN

## 2017-10-02 NOTE — OB Triage Note (Signed)
  L&D OB Triage Note  SUBJECTIVE Krystal Harrison is a 32 y.o. [id number] female at [redacted]w[redacted]d, EDD Estimated Date of Delivery: 02/04/18 who presented to triage with complaints of vaginal bleeding throughout the day yesterday and today. She state she has had bright red bleeding that is filling the majority of the toilet tissue with wiping. She passed one clot this morning. She denies recent intercourse , increased activity, or trauma. She states that 4-5 days ago her 2 yr old son jumped off of the bed into her arms and his knees hit her abdomen. She currently complains of back pain. She feels good movement.    OB History  Gravida Para Term Preterm AB Living  6 4 4  0 1 4  SAB TAB Ectopic Multiple Live Births  1 0 0 0 4    # Outcome Date GA Lbr Len/2nd Weight Sex Delivery Anes PTL Lv  6 Current           5 Term 08/29/15 [redacted]w[redacted]d 06:41 / 00:02 3680 g M Vag-Spont None  LIV     Name: Krystal Harrison,[name]     Apgar1: 8  Apgar5: 9  4 SAB 2016        FD  3 Term 09/12/11 [redacted]w[redacted]d  3969 g F Vag-Spont  Y LIV  2 Term 12/31/07 [redacted]w[redacted]d  3572 g M Vag-Spont  N LIV  1 Term 09/07/04 [redacted]w[redacted]d  3600 g F Vag-Spont   LIV    Obstetric Comments  09/12/2011 pt leaking fluid at 32wks, hospitalized until delivery at 38wks.    Medications Prior to Admission  Medication Sig Dispense Refill Last Dose  . acetaminophen-codeine (TYLENOL #3) 300-30 MG tablet Take by mouth every 4 (four) hours as needed for moderate pain.   Past Month at Unknown time  . amoxicillin (AMOXIL) 875 MG tablet TAKE 1 TABLET BY MOUTH EVERY 12 HOURS UNTIL ALL TAKEN  0 Past Week at Unknown time  . Prenatal Vit-Fe Fumarate-FA (PRENATAL MULTIVITAMIN) TABS tablet Take 1 tablet by mouth daily at 12 noon.   10/01/2017 at Unknown time     OBJECTIVE  Nursing Evaluation:   BP 113/61 (BP Location: Left Arm)   Pulse 92   Temp 98.3 F (36.8 C) (Oral)   Resp 18   Ht 5\' 5"  (1.651 m)   Wt 96.6 kg   BMI 35.45 kg/m    Findings:   No contractions, abdomen soft  NST was  performed and has been reviewed by me.  NST INTERPRETATION: Category I and appropriate for gestational age  Mode: External Baseline Rate (A): 130 bpm(fht)    ASSESSMENT Impression:  1.  Pregnancy:  [id number] at [redacted]w[redacted]d , EDD Estimated Date of Delivery: 02/04/18 2.  Spec. Exam shows no active bleeding with brown blood present cervix appears closed   PLAN 1. Ultrasound 2. U/a & FNN 3. Tylenol for back pain 4.Po hydration 5.Dr. Amalia Hailey consulted on plan of care.

## 2017-10-02 NOTE — OB Triage Note (Signed)
Pt states she had small amounts of bleeding all day yesterday, primarily when she wiped, but with some additional silver dollar sized spots in her underwear. This morning she had a penny sized clot. She states occasional contractions with back pain. Positive fetal movement; denies leakage of fluid. Will place EFM and continue to monitor.

## 2017-10-02 NOTE — Progress Notes (Signed)
Pt back from [place]. Monitors applied and assessing.

## 2017-10-02 NOTE — Progress Notes (Signed)
ANTEPARTUM COMPREHENSIVE PROGRESS NOTE  Krystal Harrison is a 32 y.o. [id number] at [redacted]w[redacted]d who is being observed for  Vaginal bleeding.  Fetal presentation is cephalic.  Length of Stay:  0 Days.  10/02/2017  Subjective: Patient reports good fetal movement.  She reports denies uterine contractions, no bleeding and no loss of fluid per vagina. Has not had any bleeding since early this morning. Has back pain in pregnancy.   Vitals:  Blood pressure 113/61, pulse 92, temperature 98.3 F (36.8 C), temperature source Oral, resp. rate 18, height 5\' 5"  (1.651 m), weight 96.6 kg. Physical Examination: CONSTITUTIONAL: Well-developed, well-nourished female in no acute distress.  HENT:  Normocephalic, atraumatic, External right and left ear normal. Oropharynx is clear and moist SKIN: Skin is warm and dry. No rash noted. Not diaphoretic. No erythema. No pallor. [place]: Alert and oriented to person, place, and time. Normal reflexes, muscle tone coordination. No cranial nerve deficit noted. PSYCHIATRIC: Normal mood and affect. Normal behavior. Normal judgment and thought content. CARDIOVASCULAR: Normal heart rate noted, regular rhythm RESPIRATORY: Effort and breath sounds normal, no problems with respiration noted MUSCULOSKELETAL: Normal range of motion. No edema and no tenderness. 2+ distal pulses. ABDOMEN: Soft, nontender, nondistended, gravid. CERVIX:  visually closed   Fetal monitoring: FHR: 130bpm,  Appropriate for gestational age.  Uterine activity: no contractions per hour  Results for orders placed or performed during the hospital encounter of 10/02/17 (from the past 48 hour(s))  Fetal fibronectin     Status: Abnormal   Collection Time: 10/02/17  8:14 AM  Result Value Ref Range   Fetal Fibronectin NEGATIVE NEGATIVE   Appearance, FETFIB PINK (A) CLEAR    Comment: Performed at [facility], [address] [place], [place] [address]  Urinalysis, Routine w reflex microscopic     Status:  Abnormal   Collection Time: 10/02/17  8:14 AM  Result Value Ref Range   Color, Urine YELLOW (A) YELLOW   APPearance CLEAR (A) CLEAR   Specific Gravity, Urine 1.016 1.005 - 1.030   pH 6.0 5.0 - 8.0   Glucose, UA NEGATIVE NEGATIVE mg/dL   Hgb urine dipstick MODERATE (A) NEGATIVE   Bilirubin Urine NEGATIVE NEGATIVE   Ketones, ur NEGATIVE NEGATIVE mg/dL   Protein, ur NEGATIVE NEGATIVE mg/dL   Nitrite NEGATIVE NEGATIVE   Leukocytes, UA TRACE (A) NEGATIVE   RBC / HPF 0-5 0 - 5 RBC/hpf   WBC, UA 6-10 0 - 5 WBC/hpf   Bacteria, UA RARE (A) NONE SEEN   Squamous Epithelial / LPF 11-20 0 - 5   Mucus PRESENT    Crystals PRESENT (A) NEGATIVE    Comment: Performed at [facility], [address] [place], [place] [address]  Kleihauer-Betke stain     Status: None   Collection Time: 10/02/17 11:37 AM  Result Value Ref Range   Fetal Cells % 0 %   Quantitation Fetal Hemoglobin 0.0000 mL    Comment: (0 TO 15 ML)   # Vials RhIg NOT INDICATED     Comment: MOM IS A POSITIVE Performed at [facility], [address] [place], [place] [address]   Fibrinogen     Status: Abnormal   Collection Time: 10/02/17 11:37 AM  Result Value Ref Range   Fibrinogen 482 (H) 210 - 475 mg/dL    Comment: Performed at [facility], [address], [place], [place] [address]    US Ob Limited  Result Date: 10/02/2017 CLINICAL DATA:  Vaginal bleeding for 1 day  EXAM: LIMITED OBSTETRIC ULTRASOUND COMPARISON:  09/10/2017 FINDINGS: Number of Fetuses: 1 Heart Rate:  130 bpm Movement: Present Presentation: Cephalic Previa: No Placental Location: Anterior, there is a hypoechoic area along the placental margin laterally on the left. A small abruption would be difficult to exclude. No other focal placental abnormality is noted. Amniotic Fluid (Subjective): Normal BPD:  5.29cm 22w 0d Maternal Findings: Cervix:  Closed Uterus/Adnexae: No abnormality visualized. IMPRESSION: Single live intrauterine  gestation at approximately 22 weeks. This is an adequate progression from the prior exam. Small hypoechoic area along the left placental margin laterally of uncertain significance. A small abruption could not be totally excluded given the patient's clinical history. Short-term follow-up examination is recommended No other focal abnormality is noted. Electronically Signed   By: Inez Catalina M.D.   On: 10/02/2017 10:47    Current scheduled medications:  Tylenol PRN   ASSESSMENT: Active Problems:   Labor and delivery, indication for care   PLAN: 24 hr observation, Fetal heart tones and toco q shift or prn. Discussed plan with pt. She verbalizes and agrees.   Continue routine antenatal care.  Philip Aspen, CNM

## 2017-10-02 NOTE — Progress Notes (Signed)
Pt transported to US

## 2017-10-03 DIAGNOSIS — O26852 Spotting complicating pregnancy, second trimester: Secondary | ICD-10-CM

## 2017-10-03 DIAGNOSIS — Z3A22 22 weeks gestation of pregnancy: Secondary | ICD-10-CM

## 2017-10-03 DIAGNOSIS — O4692 Antepartum hemorrhage, unspecified, second trimester: Secondary | ICD-10-CM | POA: Diagnosis not present

## 2017-10-03 LAB — URINE CULTURE: Culture: NO GROWTH

## 2017-10-03 NOTE — OB Triage Note (Signed)
L&D OB Triage Note  SUBJECTIVE Krystal Harrison is a 32 y.o. [id number] female at [redacted]w[redacted]d, EDD Estimated Date of Delivery: 02/04/18 who presented to triage yesterday with complaints of vaginal bleeding. She was in L&D observation for 24 hours with no bleeding. She feels good movement and has back pain with this pregnancy. She states she has gotten some relief with tylenol and heating pad.    OB History  Gravida Para Term Preterm AB Living  6 4 4  0 1 4  SAB TAB Ectopic Multiple Live Births  1 0 0 0 4    # Outcome Date GA Lbr Len/2nd Weight Sex Delivery Anes PTL Lv  6 Current           5 Term 08/29/15 [redacted]w[redacted]d 06:41 / 00:02 3680 g M Vag-Spont None  LIV     Name: [place]ala,[name]     Apgar1: 8  Apgar5: 9  4 SAB 2016        FD  3 Term 09/12/11 [redacted]w[redacted]d  3969 g F Vag-Spont  Y LIV  2 Term 12/31/07 [redacted]w[redacted]d  3572 g M Vag-Spont  N LIV  1 Term 09/07/04 [redacted]w[redacted]d  3600 g F Vag-Spont   LIV    Obstetric Comments  09/12/2011 pt leaking fluid at 32wks, hospitalized until delivery at 38wks.    Medications Prior to Admission  Medication Sig Dispense Refill Last Dose  . acetaminophen-codeine (TYLENOL #3) 300-30 MG tablet Take by mouth every 4 (four) hours as needed for moderate pain.   Past Month at Unknown time  . amoxicillin (AMOXIL) 875 MG tablet TAKE 1 TABLET BY MOUTH EVERY 12 HOURS UNTIL ALL TAKEN  0 Past Week at Unknown time  . Prenatal Vit-Fe Fumarate-FA (PRENATAL MULTIVITAMIN) TABS tablet Take 1 tablet by mouth daily at 12 noon.   10/01/2017 at Unknown time     OBJECTIVE  Nursing Evaluation:   BP (!) 103/56   Pulse 80   Temp 98.1 F (36.7 C) (Oral)   Resp 17   Ht 5\' 5"  (1.651 m)   Wt 96.6 kg   BMI 35.45 kg/m    Findings:   Appropriate for gestational age  [facility] Monitoring was performed and has been reviewed by me.  NST INTERPRETATION: Category I and appropriate for gestational age  Mode: Doppler Baseline Rate (A): 136 bpm(fht, 135-145) Variability: Moderate Accelerations: 10 x  10 Decelerations: None     Contraction Frequency (min): none  ASSESSMENT Impression:  1.  Pregnancy:  [id number] at [redacted]w[redacted]d , EDD Estimated Date of Delivery: 02/04/18 2.  NST:  Category I  PLAN 1. Reassurance given, discussed plan of care. Encouraged pt to get belly band for back and round ligament pain.  2. Discharge home with standard labor precautions given to return to L&D or call the office for problems. 3. Continue routine prenatal care. She will follow up in the office at next scheduled appointment or PRN.   Philip Aspen, CNM

## 2017-10-08 ENCOUNTER — Encounter: Payer: Medicaid Other | Admitting: Certified Nurse Midwife

## 2017-10-08 ENCOUNTER — Ambulatory Visit (INDEPENDENT_AMBULATORY_CARE_PROVIDER_SITE_OTHER): Payer: Medicaid Other

## 2017-10-08 DIAGNOSIS — Z3482 Encounter for supervision of other normal pregnancy, second trimester: Secondary | ICD-10-CM

## 2017-10-08 DIAGNOSIS — Z362 Encounter for other antenatal screening follow-up: Secondary | ICD-10-CM | POA: Diagnosis not present

## 2017-10-08 DIAGNOSIS — Z3A23 23 weeks gestation of pregnancy: Secondary | ICD-10-CM

## 2017-10-14 ENCOUNTER — Encounter: Payer: Medicaid Other | Admitting: Obstetrics and Gynecology

## 2017-10-17 ENCOUNTER — Encounter: Payer: Medicaid Other | Admitting: Obstetrics and Gynecology

## 2017-10-17 ENCOUNTER — Ambulatory Visit (INDEPENDENT_AMBULATORY_CARE_PROVIDER_SITE_OTHER): Payer: Medicaid Other | Admitting: Obstetrics and Gynecology

## 2017-10-17 VITALS — BP 113/61 | HR 86 | Wt 215.1 lb

## 2017-10-17 DIAGNOSIS — Z3492 Encounter for supervision of normal pregnancy, unspecified, second trimester: Secondary | ICD-10-CM | POA: Diagnosis not present

## 2017-10-17 LAB — POCT URINALYSIS DIPSTICK OB
Bilirubin, UA: NEGATIVE
Glucose, UA: NEGATIVE
Ketones, UA: NEGATIVE
Leukocytes, UA: NEGATIVE
Nitrite, UA: NEGATIVE
POC,PROTEIN,UA: NEGATIVE
Spec Grav, UA: 1.015 (ref 1.010–1.025)
Urobilinogen, UA: 0.2 E.U./dL
pH, UA: 6 (ref 5.0–8.0)

## 2017-10-17 NOTE — Progress Notes (Signed)
ROB- pt is doing well 

## 2017-10-17 NOTE — Progress Notes (Signed)
ROB- doing well, denies any more spotting. Does report ongoing tooth pain from dental treatment in September. Needs surgery but dentist won't do it here due to pregnancy- recommend contacting [facility] and we can give a note clearing her for it in the second trimester.

## 2017-10-20 ENCOUNTER — Encounter: Payer: Self-pay | Admitting: *Deleted

## 2017-10-20 ENCOUNTER — Other Ambulatory Visit: Payer: Self-pay

## 2017-10-20 ENCOUNTER — Observation Stay
Admission: EM | Admit: 2017-10-20 | Discharge: 2017-10-21 | Disposition: A | Discharge status: Home / Self Care | Payer: Medicaid Other | Attending: Certified Nurse Midwife | Admitting: Certified Nurse Midwife

## 2017-10-20 DIAGNOSIS — Y92414 Local residential or business street as the place of occurrence of the external cause: Secondary | ICD-10-CM | POA: Insufficient documentation

## 2017-10-20 DIAGNOSIS — M545 Low back pain: Secondary | ICD-10-CM | POA: Insufficient documentation

## 2017-10-20 DIAGNOSIS — O26893 Other specified pregnancy related conditions, third trimester: Principal | ICD-10-CM | POA: Insufficient documentation

## 2017-10-20 DIAGNOSIS — O26892 Other specified pregnancy related conditions, second trimester: Secondary | ICD-10-CM

## 2017-10-20 DIAGNOSIS — Z3A34 34 weeks gestation of pregnancy: Secondary | ICD-10-CM | POA: Insufficient documentation

## 2017-10-20 DIAGNOSIS — R109 Unspecified abdominal pain: Secondary | ICD-10-CM
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
## 2017-10-21 ENCOUNTER — Other Ambulatory Visit: Payer: Self-pay

## 2017-10-21 DIAGNOSIS — O26893 Other specified pregnancy related conditions, third trimester: Secondary | ICD-10-CM | POA: Diagnosis not present

## 2017-10-21 DIAGNOSIS — Z3A24 24 weeks gestation of pregnancy: Secondary | ICD-10-CM

## 2017-10-21 DIAGNOSIS — Y92414 Local residential or business street as the place of occurrence of the external cause: Secondary | ICD-10-CM | POA: Diagnosis not present

## 2017-10-21 DIAGNOSIS — R109 Unspecified abdominal pain: Secondary | ICD-10-CM | POA: Diagnosis present

## 2017-10-21 DIAGNOSIS — Z3A34 34 weeks gestation of pregnancy: Secondary | ICD-10-CM | POA: Diagnosis not present

## 2017-10-21 DIAGNOSIS — M545 Low back pain: Secondary | ICD-10-CM | POA: Diagnosis not present

## 2017-10-21 DIAGNOSIS — O9A212 Injury, poisoning and certain other consequences of external causes complicating pregnancy, second trimester: Secondary | ICD-10-CM | POA: Diagnosis not present

## 2017-10-21 DIAGNOSIS — O26892 Other specified pregnancy related conditions, second trimester: Secondary | ICD-10-CM | POA: Diagnosis present

## 2017-10-21 MED ORDER — OXYCODONE-ACETAMINOPHEN 5-325 MG PO TABS: 1.0000 | ORAL_TABLET | Freq: Once | ORAL | Status: DC

## 2017-10-21 MED ORDER — ACETAMINOPHEN 325 MG PO TABS
650.0000 mg | ORAL_TABLET | Freq: Once | ORAL | Status: AC
  Administered 2017-10-21: 650 mg via ORAL
  Filled 2017-10-21: qty 2

## 2017-10-21 NOTE — ED Notes (Signed)
Report called to to L&D given, to [place].
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
## 2017-10-21 NOTE — Discharge Instructions (Signed)
° °  Abdominal Pain During Pregnancy Abdominal pain is common in pregnancy. Most of the time, it does not cause harm. There are many causes of abdominal pain. Some causes are more serious than others and sometimes the cause is not known. Abdominal pain can be a sign that something is very wrong with the pregnancy or the pain may have nothing to do with the pregnancy. Always tell your health care provider if you have any abdominal pain. Follow these instructions at home:  Do not have sex or put anything in your vagina until your symptoms go away completely.  Watch your abdominal pain for any changes.  Get plenty of rest until your pain improves.  Drink enough fluid to keep your urine clear or pale yellow.  Take over-the-counter or prescription medicines only as told by your health care provider.  Keep all follow-up visits as told by your health care provider. This is important. Contact a health care provider if:  You have a fever.  Your pain gets worse or you have cramping.  Your pain continues after resting. Get help right away if:  You are bleeding, leaking fluid, or passing tissue from the vagina.  You have vomiting or diarrhea that does not go away.  You have painful or bloody urination.  You notice a decrease in your baby's movements.  You feel very weak or faint.  You have shortness of breath.  You develop a severe headache with abdominal pain.  You have abnormal vaginal discharge with abdominal pain. This information is not intended to replace advice given to you by your health care provider. Make sure you discuss any questions you have with your health care provider. Document Released: 12/24/2004 Document Revised: 10/05/2015 Document Reviewed: 07/23/2012 Elsevier Interactive Patient Education  2018 [place] back to the ER or visit your office if you experience any of the following symptoms: Contractions Bleeding Decreased fetal movement Vaginal  pressure Rectal Pressure
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
## 2017-11-13 ENCOUNTER — Telehealth: Payer: Self-pay | Admitting: Obstetrics and Gynecology

## 2017-11-13 NOTE — Telephone Encounter (Signed)
The patient feels nauseous, having hot flashes with sweats, feet/hands swollen, and she has a tingling feeling up and down arms; she is wanting to speak to her nurse.  She states these symptoms have been present all day.  Swelling in feet for several days.  No contractions, only sharp pains in lower abdomen.  No leaking fluid. She states, "I just don't feel normal." Please advise, thanks.

## 2017-11-13 NOTE — Telephone Encounter (Signed)
Called pt we discussed her symptoms, she states she has appt 11/14/17 w/[facility], if sx get worse will go to hospital tonight, pt voiced understanding

## 2017-11-14 ENCOUNTER — Other Ambulatory Visit: Payer: Medicaid Other

## 2017-11-14 ENCOUNTER — Ambulatory Visit (INDEPENDENT_AMBULATORY_CARE_PROVIDER_SITE_OTHER): Payer: Medicaid Other | Admitting: Obstetrics and Gynecology

## 2017-11-14 VITALS — BP 110/67 | HR 95 | Wt 225.8 lb

## 2017-11-14 DIAGNOSIS — Z3493 Encounter for supervision of normal pregnancy, unspecified, third trimester: Secondary | ICD-10-CM

## 2017-11-14 DIAGNOSIS — Z131 Encounter for screening for diabetes mellitus: Secondary | ICD-10-CM

## 2017-11-14 DIAGNOSIS — Z130 Encounter for screening for diseases of the blood and blood-forming organs and certain disorders involving the immune mechanism: Secondary | ICD-10-CM

## 2017-11-14 LAB — POCT URINALYSIS DIPSTICK OB
Bilirubin, UA: NEGATIVE
Blood, UA: NEGATIVE
Glucose, UA: NEGATIVE
Ketones, UA: NEGATIVE
Nitrite, UA: NEGATIVE
POC,PROTEIN,UA: NEGATIVE
Spec Grav, UA: 1.015 (ref 1.010–1.025)
Urobilinogen, UA: 0.2 E.U./dL
pH, UA: 6 (ref 5.0–8.0)

## 2017-11-14 NOTE — Progress Notes (Signed)
ROB- glucola done today, blood consent signed,pt is doing well

## 2017-11-14 NOTE — Patient Instructions (Signed)

## 2017-11-14 NOTE — Progress Notes (Signed)
ROB and glucola- doing well, except reports feeling 'bad' all day yesterday. States nausea and both arms tingling and headache, felt better after a nap. Watch weight gain.

## 2017-11-15 LAB — CBC
Hematocrit: 30.4 % — ABNORMAL LOW (ref 34.0–46.6)
Hemoglobin: 9.9 g/dL — ABNORMAL LOW (ref 11.1–15.9)
MCH: 26.8 pg (ref 26.6–33.0)
MCHC: 32.6 g/dL (ref 31.5–35.7)
MCV: 82 fL (ref 79–97)
Platelets: 244 10*3/uL (ref 150–450)
RBC: 3.69 x10E6/uL — ABNORMAL LOW (ref 3.77–5.28)
RDW: 13.3 % (ref 12.3–15.4)
WBC: 11.6 10*3/uL — ABNORMAL HIGH (ref 3.4–10.8)

## 2017-11-15 LAB — GLUCOSE, 1 HOUR GESTATIONAL: Gestational Diabetes Screen: 115 mg/dL (ref 65–139)

## 2017-11-15 LAB — RPR: RPR Ser Ql: NONREACTIVE

## 2017-11-18 ENCOUNTER — Other Ambulatory Visit: Payer: Self-pay | Admitting: Obstetrics and Gynecology

## 2017-11-18 MED ORDER — CITRANATAL BLOOM 90-1 MG PO TABS: 1.0000 | ORAL_TABLET | Freq: Every day | ORAL | 11 refills | Status: DC

## 2017-11-27 ENCOUNTER — Encounter: Payer: Self-pay | Admitting: *Deleted

## 2017-12-01 ENCOUNTER — Ambulatory Visit (INDEPENDENT_AMBULATORY_CARE_PROVIDER_SITE_OTHER): Payer: Medicaid Other | Admitting: Certified Nurse Midwife

## 2017-12-01 VITALS — BP 89/64 | HR 87 | Wt 230.6 lb

## 2017-12-01 DIAGNOSIS — O2600 Excessive weight gain in pregnancy, unspecified trimester: Secondary | ICD-10-CM | POA: Insufficient documentation

## 2017-12-01 DIAGNOSIS — Z3493 Encounter for supervision of normal pregnancy, unspecified, third trimester: Secondary | ICD-10-CM

## 2017-12-01 DIAGNOSIS — O2603 Excessive weight gain in pregnancy, third trimester: Secondary | ICD-10-CM

## 2017-12-01 LAB — POCT URINALYSIS DIPSTICK OB
Bilirubin, UA: NEGATIVE
Blood, UA: NEGATIVE
Glucose, UA: NEGATIVE
Ketones, UA: NEGATIVE
Leukocytes, UA: NEGATIVE
Nitrite, UA: NEGATIVE
POC,PROTEIN,UA: NEGATIVE
Spec Grav, UA: 1.01 (ref 1.010–1.025)
Urobilinogen, UA: 0.2 E.U./dL
pH, UA: 7.5 (ref 5.0–8.0)

## 2017-12-01 NOTE — Progress Notes (Signed)
ROB, no complaints.  

## 2017-12-01 NOTE — Patient Instructions (Signed)
Common Medications Safe in Pregnancy  Acne:      Constipation:  Benzoyl Peroxide     Colace  Clindamycin      Dulcolax Suppository  Topica Erythromycin     Fibercon  Salicylic Acid      Metamucil         Miralax AVOID:        Senakot   Accutane    Cough:  Retin-A       Cough Drops  Tetracycline      Phenergan w/ Codeine if Rx  Minocycline      Robitussin (Plain & DM)  Antibiotics:     Crabs/Lice:  Ceclor       RID  Cephalosporins    AVOID:  E-Mycins      Kwell  Keflex  Macrobid/Macrodantin   Diarrhea:  Penicillin      Kao-Pectate  Zithromax      Imodium AD         PUSH FLUIDS AVOID:       Cipro     Fever:  Tetracycline      Tylenol (Regular or Extra  Minocycline       Strength)  Levaquin      Extra Strength-Do not          Exceed 8 tabs/24 hrs Caffeine:        <200mg/day (equiv. To 1 cup of coffee or  approx. 3 12 oz sodas)         Gas: Cold/Hayfever:       Gas-X  Benadryl      Mylicon  Claritin       Phazyme  **Claritin-D        Chlor-Trimeton    Headaches:  Dimetapp      ASA-Free Excedrin  Drixoral-Non-Drowsy     Cold Compress  Mucinex (Guaifenasin)     Tylenol (Regular or Extra  Sudafed/Sudafed-12 Hour     Strength)  **Sudafed PE Pseudoephedrine   Tylenol Cold & Sinus     Vicks Vapor Rub  Zyrtec  **AVOID if Problems With Blood Pressure         Heartburn: Avoid lying down for at least 1 hour after meals  Aciphex      Maalox     Rash:  Milk of Magnesia     Benadryl    Mylanta       1% Hydrocortisone Cream  Pepcid  Pepcid Complete   Sleep Aids:  Prevacid      Ambien   Prilosec       Benadryl  Rolaids       Chamomile Tea  Tums (Limit 4/day)     Unisom  Zantac       Tylenol PM         Warm milk-add vanilla or  Hemorrhoids:       Sugar for taste  Anusol/Anusol H.C.  (RX: Analapram 2.5%)  Sugar Substitutes:  Hydrocortisone OTC     Ok in moderation  Preparation H      Tucks        Vaseline lotion applied to tissue with  wiping    Herpes:     Throat:  Acyclovir      Oragel  Famvir  Valtrex     Vaccines:         Flu Shot Leg Cramps:       *Gardasil  Benadryl      Hepatitis A         Hepatitis B Nasal Spray:         Pneumovax  Saline Nasal Spray     Polio Booster         Tetanus Nausea:       Tuberculosis test or PPD  Vitamin B6 25 mg TID   AVOID:    Dramamine      *Gardasil  Emetrol       Live Poliovirus  Ginger Root 250 mg QID    MMR (measles, mumps &  High Complex Carbs @ Bedtime    rebella)  Sea Bands-Accupressure    Varicella (Chickenpox)  Unisom 1/2 tab TID     *No known complications           If received before Pain:         Known pregnancy;   Darvocet       Resume series after  Lortab        Delivery  Percocet    Yeast:   Tramadol      Femstat  Tylenol 3      Gyne-lotrimin  Ultram       Monistat  Vicodin           MISC:         All Sunscreens           Hair Coloring/highlights          Insect Repellant's          (Including DEET)         Mystic Tans Back Pain in Pregnancy Back pain during pregnancy is common. Back pain may be caused by several factors that are related to changes during your pregnancy. Follow these instructions at home: Managing pain, stiffness, and swelling  If directed, apply ice for sudden (acute) back pain. ? Put ice in a plastic bag. ? Place a towel between your skin and the bag. ? Leave the ice on for 20 minutes, 2-3 times per day.  If directed, apply heat to the affected area before you exercise: ? Place a towel between your skin and the heat pack or heating pad. ? Leave the heat on for 20-30 minutes. ? Remove the heat if your skin turns bright red. This is especially important if you are unable to feel pain, heat, or cold. You may have a greater risk of getting burned. Activity  Exercise as told by your health care provider. Exercising is the best way to prevent or manage back pain.  Listen to your body when lifting. If lifting hurts, ask for help or  bend your knees. This uses your leg muscles instead of your back muscles.  Squat down when picking up something from the floor. Do not bend over.  Only use bed rest as told by your health care provider. Bed rest should only be used for the most severe episodes of back pain. Standing, Sitting, and Lying Down  Do not stand in one place for long periods of time.  Use good posture when sitting. Make sure your head rests over your shoulders and is not hanging forward. Use a pillow on your lower back if necessary.  Try sleeping on your side, preferably the left side, with a pillow or two between your legs. If you are sore after a night's rest, your bed may be too soft. A firm mattress may provide more support for your back during pregnancy. General instructions  Do not wear high heels.  Eat a healthy diet. Try to gain weight within your health care provider's recommendations.  Use a maternity girdle, elastic sling, or   back brace as told by your health care provider.  Take over-the-counter and prescription medicines only as told by your health care provider.  Keep all follow-up visits as told by your health care provider. This is important. This includes any visits with any specialists, such as a physical therapist. Contact a health care provider if:  Your back pain interferes with your daily activities.  You have increasing pain in other parts of your body. Get help right away if:  You develop numbness, tingling, weakness, or problems with the use of your arms or legs.  You develop severe back pain that is not controlled with medicine.  You have a sudden change in bowel or bladder control.  You develop shortness of breath, dizziness, or you faint.  You develop nausea, vomiting, or sweating.  You have back pain that is a rhythmic, cramping pain similar to labor pains. Labor pain is usually 1-2 minutes apart, lasts for about 1 minute, and involves a bearing down feeling or pressure in  your pelvis.  You have back pain and your water breaks or you have vaginal bleeding.  You have back pain or numbness that travels down your leg.  Your back pain developed after you fell.  You develop pain on one side of your back.  You see blood in your urine.  You develop skin blisters in the area of your back pain. This information is not intended to replace advice given to you by your health care provider. Make sure you discuss any questions you have with your health care provider. Document Released: 04/03/2005 Document Revised: 06/01/2015 Document Reviewed: 09/07/2014 Elsevier Interactive Patient Education  2018 [place] of Pregnancy The third trimester is from week 29 through week 42, months 7 through 9. This trimester is when your unborn baby (fetus) is growing very fast. At the end of the ninth month, the unborn baby is about 20 inches in length. It weighs about 6-10 pounds. Follow these instructions at home:  Avoid all smoking, herbs, and alcohol. Avoid drugs not approved by your doctor.  Do not use any tobacco products, including cigarettes, chewing tobacco, and electronic cigarettes. If you need help quitting, ask your doctor. You may get counseling or other support to help you quit.  Only take medicine as told by your doctor. Some medicines are safe and some are not during pregnancy.  Exercise only as told by your doctor. Stop exercising if you start having cramps.  Eat regular, healthy meals.  Wear a good support bra if your breasts are tender.  Do not use hot tubs, steam rooms, or saunas.  Wear your seat belt when driving.  Avoid raw meat, uncooked cheese, and liter boxes and soil used by cats.  Take your prenatal vitamins.  Take 1500-2000 milligrams of calcium daily starting at the 20th week of pregnancy until you deliver your baby.  Try taking medicine that helps you poop (stool softener) as needed, and if your doctor approves. Eat more  fiber by eating fresh fruit, vegetables, and whole grains. Drink enough fluids to keep your pee (urine) clear or pale yellow.  Take warm water baths (sitz baths) to soothe pain or discomfort caused by hemorrhoids. Use hemorrhoid cream if your doctor approves.  If you have puffy, bulging veins (varicose veins), wear support hose. Raise (elevate) your feet for 15 minutes, 3-4 times a day. Limit salt in your diet.  Avoid heavy lifting, wear low heels, and sit up straight.  Rest with your legs  raised if you have leg cramps or low back pain.  Visit your dentist if you have not gone during your pregnancy. Use a soft toothbrush to brush your teeth. Be gentle when you floss.  You can have sex (intercourse) unless your doctor tells you not to.  Do not travel far distances unless you must. Only do so with your doctor's approval.  Take prenatal classes.  Practice driving to the hospital.  Pack your hospital bag.  Prepare the baby's room.  Go to your doctor visits. Get help if:  You are not sure if you are in labor or if your water has broken.  You are dizzy.  You have mild cramps or pressure in your lower belly (abdominal).  You have a nagging pain in your belly area.  You continue to feel sick to your stomach (nauseous), throw up (vomit), or have watery poop (diarrhea).  You have bad smelling fluid coming from your vagina.  You have pain with peeing (urination). Get help right away if:  You have a fever.  You are leaking fluid from your vagina.  You are spotting or bleeding from your vagina.  You have severe belly cramping or pain.  You lose or gain weight rapidly.  You have trouble catching your breath and have chest pain.  You notice sudden or extreme puffiness (swelling) of your face, hands, ankles, feet, or legs.  You have not felt the baby move in over an hour.  You have severe headaches that do not go away with medicine.  You have vision changes. This  information is not intended to replace advice given to you by your health care provider. Make sure you discuss any questions you have with your health care provider. Document Released: 03/20/2009 Document Revised: 06/01/2015 Document Reviewed: 02/25/2012 Elsevier Interactive Patient Education  2017 [employer].

## 2017-12-01 NOTE — Progress Notes (Signed)
ROB-Reports intermittent tooth pain requiring twice daily Tylenol dosing. Discussed home treatment measures and encouraged follow up with Dentist and Oral Surgeon as indicated. Anticipatory guidance regarding course of prenatal care. Reviewed red flag symptoms and when to call. RTC x 2 weeks for ROB or sooner if needed.

## 2017-12-16 ENCOUNTER — Ambulatory Visit (INDEPENDENT_AMBULATORY_CARE_PROVIDER_SITE_OTHER): Payer: Medicaid Other | Admitting: Obstetrics and Gynecology

## 2017-12-16 VITALS — BP 123/74 | HR 98 | Wt 233.2 lb

## 2017-12-16 DIAGNOSIS — Z3493 Encounter for supervision of normal pregnancy, unspecified, third trimester: Secondary | ICD-10-CM | POA: Diagnosis not present

## 2017-12-16 LAB — POCT URINALYSIS DIPSTICK OB
Bilirubin, UA: NEGATIVE
Blood, UA: NEGATIVE
Glucose, UA: NEGATIVE
Ketones, UA: NEGATIVE
Leukocytes, UA: NEGATIVE
Nitrite, UA: NEGATIVE
Spec Grav, UA: 1.01 (ref 1.010–1.025)
Urobilinogen, UA: 0.2 E.U./dL
pH, UA: 6.5 (ref 5.0–8.0)

## 2017-12-16 MED ORDER — POLYETHYLENE GLYCOL 3350 17 G PO PACK: 17.0000 g | PACK | Freq: Every day | ORAL | 0 refills | Status: DC

## 2017-12-16 NOTE — Progress Notes (Signed)
ROB- discussed food changes to help with constipation including miralax.

## 2017-12-16 NOTE — Patient Instructions (Signed)

## 2017-12-16 NOTE — Progress Notes (Signed)
ROB- pt is having some pelvic pressure, some constipation

## 2018-01-02 ENCOUNTER — Ambulatory Visit (INDEPENDENT_AMBULATORY_CARE_PROVIDER_SITE_OTHER): Payer: Medicaid Other | Admitting: Certified Nurse Midwife

## 2018-01-02 ENCOUNTER — Encounter: Payer: Self-pay | Admitting: Certified Nurse Midwife

## 2018-01-02 VITALS — BP 115/67 | HR 87 | Wt 238.0 lb

## 2018-01-02 DIAGNOSIS — Z3493 Encounter for supervision of normal pregnancy, unspecified, third trimester: Secondary | ICD-10-CM | POA: Diagnosis not present

## 2018-01-02 LAB — POCT URINALYSIS DIPSTICK OB
Bilirubin, UA: NEGATIVE
Blood, UA: NEGATIVE
Glucose, UA: NEGATIVE
Ketones, UA: NEGATIVE
Nitrite, UA: NEGATIVE
Spec Grav, UA: 1.01 (ref 1.010–1.025)
Urobilinogen, UA: 0.2 E.U./dL
pH, UA: 6.5 (ref 5.0–8.0)

## 2018-01-02 LAB — OB RESULTS CONSOLE GBS: GBS: NEGATIVE

## 2018-01-02 LAB — OB RESULTS CONSOLE GC/CHLAMYDIA: Gonorrhea: NEGATIVE

## 2018-01-02 NOTE — Patient Instructions (Signed)
Group B Streptococcus Infection During Pregnancy  Group B Streptococcus (GBS) is a type of bacteria (Streptococcus agalactiae) that is often found in healthy people, commonly in the rectum, vagina, and intestines. In people who are healthy and not pregnant, the bacteria rarely cause serious illness or complications. However, women who test positive for GBS during pregnancy can pass the bacteria to their baby during childbirth, which can cause serious infection in the baby after birth. Women with GBS may also have infections during their pregnancy or immediately after childbirth, such as such as urinary tract infections (UTIs) or infections of the uterus (uterine infections). Having GBS also increases a woman's risk of complications during pregnancy, such as early (preterm) labor or delivery, miscarriage, or stillbirth. Routine testing (screening) for GBS is recommended for all pregnant women. What increases the risk? You may have a higher risk for GBS infection during pregnancy if you had one during a past pregnancy. What are the signs or symptoms? In most cases, GBS infection does not cause symptoms in pregnant women. Signs and symptoms of a possible GBS-related infection may include:  Labor starting before the 37th week of pregnancy.  A UTI or bladder infection, which may cause: ? Fever. ? Pain or burning during urination. ? Frequent urination.  Fever during labor, along with: ? Bad-smelling discharge. ? Uterine tenderness. ? Rapid heartbeat in the mother, baby, or both. Rare but serious symptoms of a possible GBS-related infection in women include:  Blood infection (septicemia). This may cause fever, chills, or confusion.  Lung infection (pneumonia). This may cause fever, chills, cough, rapid breathing, difficulty breathing, or chest pain.  Bone, joint, skin, or soft tissue infection. How is this diagnosed? You may be screened for GBS between week 35 and week 37 of your pregnancy. If  you have symptoms of preterm labor, you may be screened earlier. This condition is diagnosed based on lab test results from:  A swab of fluid from the vagina and rectum.  A urine sample. How is this treated? This condition is treated with antibiotic medicine. When you go into labor, or as soon as your water breaks (your membranes rupture), you will be given antibiotics through an IV tube. Antibiotics will continue until after you give birth. If you are having a cesarean delivery, you do not need antibiotics unless your membranes have already ruptured. Follow these instructions at home:  Take over-the-counter and prescription medicines only as told by your health care provider.  Take your antibiotic medicine as told by your health care provider. Do not stop taking the antibiotic even if you start to feel better.  Keep all pre-birth (prenatal) visits and follow-up visits as told by your health care provider. This is important. Contact a health care provider if:  You have pain or burning when you urinate.  You have to urinate frequently.  You have a fever or chills.  You develop a bad-smelling vaginal discharge. Get help right away if:  Your membranes rupture.  You go into labor.  You have severe pain in your abdomen.  You have difficulty breathing.  You have chest pain. This information is not intended to replace advice given to you by your health care provider. Make sure you discuss any questions you have with your health care provider. Document Released: 04/02/2007 Document Revised: 07/21/2015 Document Reviewed: 07/20/2015 Elsevier Interactive Patient Education  2019 [employer]  

## 2018-01-02 NOTE — Progress Notes (Signed)
ROB doing well. Pt has  Been feeling more contractions , reviewed sign & symptoms of labor. GBS and cultures completed today. Follow up 1 wk.   Philip Aspen, CNM

## 2018-01-03 ENCOUNTER — Observation Stay
Admission: EM | Admit: 2018-01-03 | Discharge: 2018-01-03 | Disposition: A | Discharge status: Home / Self Care | Payer: Medicaid Other | Attending: Certified Nurse Midwife | Admitting: Certified Nurse Midwife

## 2018-01-03 ENCOUNTER — Other Ambulatory Visit: Payer: Self-pay

## 2018-01-03 DIAGNOSIS — O4703 False labor before 37 completed weeks of gestation, third trimester: Secondary | ICD-10-CM

## 2018-01-03 DIAGNOSIS — Z3A35 35 weeks gestation of pregnancy: Secondary | ICD-10-CM

## 2018-01-03 LAB — ROM PLUS (ARMC ONLY): Rom Plus: NEGATIVE

## 2018-01-03 MED ORDER — HYDROXYZINE HCL 50 MG/ML IM SOLN
50.0000 mg | Freq: Four times a day (QID) | INTRAMUSCULAR | Status: DC | PRN
  Filled 2018-01-03: qty 1

## 2018-01-03 MED ORDER — ACETAMINOPHEN 500 MG PO TABS: 1000.0000 mg | ORAL_TABLET | Freq: Four times a day (QID) | ORAL | Status: DC | PRN

## 2018-01-03 NOTE — OB Triage Note (Signed)
Pt c/o ctx every 23min, brown discharge, and leaking of clear fluid 12/28.  Normal fetal movement.

## 2018-01-04 LAB — GC/CHLAMYDIA PROBE AMP
Chlamydia trachomatis, NAA: NEGATIVE
Neisseria gonorrhoeae by PCR: NEGATIVE

## 2018-01-04 NOTE — Discharge Summary (Signed)
   Obstetric Discharge Summary  Patient ID: Krystal Harrison MRN: [id number] DOB/AGE: [birth year] 32 y.o.   Date of Admission: 01/03/2018  Date of Discharge: 01/03/2018  Admitting Diagnosis: Observation at [redacted]w[redacted]d    Discharge Diagnosis: False labor   Antepartum Procedures: Oral hydration and NST   Brief Hospital Course   L&D OB Triage Note  Krystal Harrison is a 32 y.o. [id number] female at [redacted]w[redacted]d, EDD Estimated Date of Delivery: 02/04/18 who presented to triage for complaints of uterine contractions, leakage of fluid, and brown vaginal discharge.  She was evaluated by the nurses with no significant findings for spontaneous rupture of membranes, preterm labor, or fetal distress. Vital signs stable. An NST was performed and has been reviewed by CNM.    NST INTERPRETATION:  Indications: rule out uterine contractions  Mode: External Baseline Rate (A): 140 bpm(monitors removed by pt., provider informed) Variability: Moderate Accelerations: 15 x 15 Decelerations: None Contraction Frequency (min): UI  Impression: reactive   Lab Orders     [facility] only)-Negative  Plan: NST performed was reviewed and was found to be reactive. She was discharged home with bleeding/labor precautions.  Continue routine prenatal care. Follow up with CNM as previously scheduled.   Discharge Instructions: Per After Visit Summary.  Activity: Also refer to After Visit Summary.  Diet: Regular  Medications:  Allergies as of 01/03/2018   No Known Allergies     Medication List    ASK your doctor about these medications   acetaminophen 500 MG tablet Commonly known as:  TYLENOL Take 500 mg by mouth every 6 (six) hours as needed.   CITRANATAL BLOOM 90-1 MG Tabs Take 1 tablet by mouth daily.   polyethylene glycol packet Commonly known as:  MIRALAX / GLYCOLAX Take 17 g by mouth daily.      Outpatient follow up: With CNM as previously scheduled  Postpartum contraception: bilateral tubal  ligation  Discharged Condition: stable  Discharged to: home   Diona Fanti, CNM [facility], [facility]

## 2018-01-05 ENCOUNTER — Other Ambulatory Visit: Payer: Self-pay | Admitting: *Deleted

## 2018-01-05 MED ORDER — OSELTAMIVIR PHOSPHATE 75 MG PO CAPS: 75.0000 mg | ORAL_CAPSULE | Freq: Every day | ORAL | 0 refills | Status: DC

## 2018-01-06 LAB — CULTURE, BETA STREP (GROUP B ONLY): Strep Gp B Culture: NEGATIVE

## 2018-01-07 NOTE — L&D Delivery Note (Signed)
Delivery Note  [phone] In room to see patient, pushing with RN at bedside. Effective maternal efforts noted, fetal vertex visualized.  3500 Spontaneous vaginal delivery of liveborn female patient in right occiput anterior position over intact perineum. Infant immediately to maternal abdomen. Delayed cord clamping, skin to skin, and three (3) vessel cord. APGARs: 8, 9. Weight: pending. Receiving nurse present at bedside for birth.   Pitocin infusing. 0620 Spontaneous delivery of intact placenta. Uterus firm. Lochia small. Clots evacuated with fundal massage. No lacerations. Adequate epidural anesthesia. QBL: 320 ml. Vault check completed. Counts correct x 2.   Initiate routine postpartum care and orders. Mom to postpartum.  Baby to Couplet care / Skin to Skin.   Diona Fanti, CNM [facility], [facility] 01/29/2018, 6:50 AM

## 2018-01-08 ENCOUNTER — Ambulatory Visit (INDEPENDENT_AMBULATORY_CARE_PROVIDER_SITE_OTHER): Payer: Medicaid Other | Admitting: Obstetrics and Gynecology

## 2018-01-08 VITALS — BP 115/60 | HR 88 | Wt 238.0 lb

## 2018-01-08 DIAGNOSIS — J029 Acute pharyngitis, unspecified: Secondary | ICD-10-CM

## 2018-01-08 MED ORDER — CEFDINIR 300 MG PO CAPS: 300.0000 mg | ORAL_CAPSULE | Freq: Two times a day (BID) | ORAL | 1 refills | Status: DC

## 2018-01-08 NOTE — Progress Notes (Signed)
ROB- pt c/o very sore throat, cough-productive, she is SOB, having some pelvic pressure and contractions

## 2018-01-08 NOTE — Patient Instructions (Signed)

## 2018-01-08 NOTE — Progress Notes (Signed)
ROB-c/o URI s/s cough, sore throat, and head congestion, took tamiflu and no better. Denies any fever. Cough keeps her awake. Lungs with slight rhales bilaterally throat red without white patches. Strep test negative. Negative lymphandenopathy. Dry cough noted.

## 2018-01-15 ENCOUNTER — Encounter: Payer: Medicaid Other | Admitting: Certified Nurse Midwife

## 2018-01-16 ENCOUNTER — Encounter: Payer: Medicaid Other | Admitting: Certified Nurse Midwife

## 2018-01-16 ENCOUNTER — Other Ambulatory Visit: Payer: Self-pay

## 2018-01-16 ENCOUNTER — Ambulatory Visit (INDEPENDENT_AMBULATORY_CARE_PROVIDER_SITE_OTHER): Payer: Medicaid Other | Admitting: Certified Nurse Midwife

## 2018-01-16 ENCOUNTER — Observation Stay
Admission: EM | Admit: 2018-01-16 | Discharge: 2018-01-17 | Disposition: A | Discharge status: Home / Self Care | Payer: Medicaid Other | Attending: Obstetrics and Gynecology | Admitting: Obstetrics and Gynecology

## 2018-01-16 VITALS — BP 114/72 | HR 97 | Wt 241.2 lb

## 2018-01-16 DIAGNOSIS — Z3490 Encounter for supervision of normal pregnancy, unspecified, unspecified trimester: Secondary | ICD-10-CM

## 2018-01-16 DIAGNOSIS — O26893 Other specified pregnancy related conditions, third trimester: Secondary | ICD-10-CM | POA: Insufficient documentation

## 2018-01-16 DIAGNOSIS — N898 Other specified noninflammatory disorders of vagina: Secondary | ICD-10-CM | POA: Insufficient documentation

## 2018-01-16 DIAGNOSIS — Z3A37 37 weeks gestation of pregnancy: Secondary | ICD-10-CM | POA: Insufficient documentation

## 2018-01-16 DIAGNOSIS — Z3493 Encounter for supervision of normal pregnancy, unspecified, third trimester: Secondary | ICD-10-CM | POA: Diagnosis not present

## 2018-01-16 DIAGNOSIS — O471 False labor at or after 37 completed weeks of gestation: Principal | ICD-10-CM | POA: Insufficient documentation

## 2018-01-16 LAB — POCT URINALYSIS DIPSTICK OB
Bilirubin, UA: NEGATIVE
Blood, UA: NEGATIVE
Glucose, UA: NEGATIVE
Ketones, UA: NEGATIVE
Leukocytes, UA: NEGATIVE
Nitrite, UA: NEGATIVE
Spec Grav, UA: 1.02 (ref 1.010–1.025)
Urobilinogen, UA: 0.2 E.U./dL
pH, UA: 6 (ref 5.0–8.0)

## 2018-01-16 NOTE — Progress Notes (Signed)
ROB, c/o intermittent lower pelvic and vaginal pressure x4 days.

## 2018-01-16 NOTE — Progress Notes (Signed)
ROB-Reports intermittent pelvic pressure, request SVE. Bleeding after vaginal exam. FHT reassessed WNL. Discussed home treatment measures and labor preparation techniques. Reviewed red flag symptoms and when to call. RTC x 1 week for ROB or sooner if needed.

## 2018-01-16 NOTE — Patient Instructions (Signed)

## 2018-01-17 DIAGNOSIS — O471 False labor at or after 37 completed weeks of gestation: Secondary | ICD-10-CM | POA: Diagnosis present

## 2018-01-17 DIAGNOSIS — O26893 Other specified pregnancy related conditions, third trimester: Secondary | ICD-10-CM | POA: Diagnosis present

## 2018-01-17 DIAGNOSIS — Z3A37 37 weeks gestation of pregnancy: Secondary | ICD-10-CM

## 2018-01-17 DIAGNOSIS — Z3490 Encounter for supervision of normal pregnancy, unspecified, unspecified trimester: Secondary | ICD-10-CM

## 2018-01-17 DIAGNOSIS — N898 Other specified noninflammatory disorders of vagina: Secondary | ICD-10-CM | POA: Diagnosis present

## 2018-01-17 MED ORDER — CALCIUM CARBONATE ANTACID 500 MG PO CHEW: 2.0000 | CHEWABLE_TABLET | ORAL | Status: DC | PRN

## 2018-01-18 ENCOUNTER — Observation Stay
Admission: EM | Admit: 2018-01-18 | Discharge: 2018-01-18 | Disposition: A | Discharge status: Home / Self Care | Payer: Medicaid Other | Attending: Certified Nurse Midwife | Admitting: Certified Nurse Midwife

## 2018-01-18 ENCOUNTER — Encounter: Payer: Self-pay | Admitting: *Deleted

## 2018-01-18 DIAGNOSIS — Z3A37 37 weeks gestation of pregnancy: Secondary | ICD-10-CM | POA: Insufficient documentation

## 2018-01-18 DIAGNOSIS — Z3490 Encounter for supervision of normal pregnancy, unspecified, unspecified trimester: Secondary | ICD-10-CM

## 2018-01-18 DIAGNOSIS — O471 False labor at or after 37 completed weeks of gestation: Secondary | ICD-10-CM | POA: Diagnosis not present

## 2018-01-18 DIAGNOSIS — Z79899 Other long term (current) drug therapy: Secondary | ICD-10-CM | POA: Diagnosis not present

## 2018-01-18 HISTORY — DX: Other complications of anesthesia, initial encounter: T88.59XA

## 2018-01-18 HISTORY — DX: Adverse effect of unspecified anesthetic, initial encounter: T41.45XA

## 2018-01-18 MED ORDER — ACETAMINOPHEN 500 MG PO TABS
1000.0000 mg | ORAL_TABLET | Freq: Once | ORAL | Status: AC
  Administered 2018-01-18: 1000 mg via ORAL
  Filled 2018-01-18: qty 2

## 2018-01-18 MED ORDER — ONDANSETRON 4 MG PO TBDP
4.0000 mg | ORAL_TABLET | Freq: Once | ORAL | Status: AC
  Administered 2018-01-18: 4 mg via ORAL
  Filled 2018-01-18: qty 1

## 2018-01-18 NOTE — Discharge Instructions (Signed)
Please keep scheduled appointment.  If you need to be seen before please call the office to be worked in.  You may call the on call provider for questions or concerns.  You may also call the Birthplace nurse's desk at [phone] to ask questions.  If you have urgent concerns please go to the nearest Emergency department for evaluation.

## 2018-01-18 NOTE — OB Triage Note (Signed)
   L&D OB Triage Note  SUBJECTIVE Jetaun Colbath is a 33 y.o. [id number] female at [redacted]w[redacted]d, EDD Estimated Date of Delivery: 02/04/18 who presented to triage with complaints of irregular contractions.   OB History  Gravida Para Term Preterm AB Living  6 4 4  0 1 4  SAB TAB Ectopic Multiple Live Births  1 0 0 0 4    # Outcome Date GA Lbr Len/2nd Weight Sex Delivery Anes PTL Lv  6 Current           5 Term 08/29/15 [redacted]w[redacted]d 06:41 / 00:02 3680 g M Vag-Spont None  LIV     Name: Buechele,[name]     Apgar1: 8  Apgar5: 9  4 SAB 2016        FD  3 Term 09/12/11 [redacted]w[redacted]d  3969 g F Vag-Spont  Y LIV  2 Term 12/31/07 [redacted]w[redacted]d  3572 g M Vag-Spont  N LIV  1 Term 09/07/04 [redacted]w[redacted]d  3600 g F Vag-Spont   LIV    Obstetric Comments  09/12/2011 pt leaking fluid at 32wks, hospitalized until delivery at 38wks.    Medications Prior to Admission  Medication Sig Dispense Refill Last Dose  . acetaminophen (TYLENOL) 500 MG tablet Take 500 mg by mouth every 6 (six) hours as needed.   Taking  . Acetaminophen-Codeine (TYLENOL/CODEINE #3) 300-30 MG tablet Take 1 tablet by mouth every 4 (four) hours as needed for pain.   Taking  . cefdinir (OMNICEF) 300 MG capsule Take 1 capsule (300 mg total) by mouth 2 (two) times daily. 14 capsule 1 Taking  . Prenatal-DSS-FeCb-FeGl-FA (CITRANATAL BLOOM) 90-1 MG TABS Take 1 tablet by mouth daily. 30 tablet 11 01/16/2018 at Unknown time     OBJECTIVE  Nursing Evaluation:   BP 125/72   Pulse 97   Temp 98 F (36.7 C) (Oral)   Resp 16   Ht 5\' 5"  (1.651 m)   Wt 110.2 kg   BMI 40.44 kg/m    Findings:  Irregular contractions   NST was performed and has been reviewed by me.  NST INTERPRETATION: Category I  Mode: External(monitors applied and assessing)  Baseline 130 Accelerations present Decelerations absent Moderate variability   Toco: irregular mild contractions   ASSESSMENT Impression:  1.  Pregnancy:  [id number] at [redacted]w[redacted]d , EDD Estimated Date of Delivery: 02/04/18 2.  NST:   Category I  PLAN 1. Reassurance given, discussed using benadryl to help her sleep. If unable to sleep with benadryl she is to contract office tomorrow and we can prescribe Ambien.  2. Discharge home with standard labor precautions given to return to L&D or call the office for problems. 3. Continue routine prenatal care.  Philip Aspen, CNM

## 2018-01-20 NOTE — Discharge Summary (Signed)
L&D OB Triage Note  Krystal Harrison is a 33 y.o. G7F5953 female at [redacted]w[redacted]d, EDD Estimated Date of Delivery: 02/04/18 who presented to triage for complaints of vaginal spotting and irregular contractions since this morning.[name]  She was evaluated by the nurses with no significant findings/findings significant for labor. Vital signs stable. An NST was performed and has been reviewed by myself. She was reassured.   NST INTERPRETATION: Indications: rule out uterine contractions  Mode: External Baseline Rate (A): 130 bpm Variability: Moderate Accelerations: 15 x 15 Decelerations: None     Contraction Frequency (min): 3-4  Impression: reactive   Plan: NST performed was reviewed and was found to be reactive. She was discharged home with bleeding/labor precautions.  Continue routine prenatal care. Follow up with OB/GYN as previously scheduled.     Melody Rockney Ghee, CNM

## 2018-01-23 ENCOUNTER — Ambulatory Visit (INDEPENDENT_AMBULATORY_CARE_PROVIDER_SITE_OTHER): Payer: Medicaid Other | Admitting: Certified Nurse Midwife

## 2018-01-23 ENCOUNTER — Encounter: Payer: Self-pay | Admitting: Certified Nurse Midwife

## 2018-01-23 VITALS — BP 129/82 | HR 89 | Wt 250.1 lb

## 2018-01-23 DIAGNOSIS — Z3493 Encounter for supervision of normal pregnancy, unspecified, third trimester: Secondary | ICD-10-CM

## 2018-01-23 NOTE — Progress Notes (Signed)
ROB doing well. Feels good movement. Has irregular contractions. Reviewed labor. Follow up 1 wk with Melody   Philip Aspen, CNM

## 2018-01-23 NOTE — Patient Instructions (Signed)
Braxton Hicks Contractions Contractions of the uterus can occur throughout pregnancy, but they are not always a sign that you are in labor. You may have practice contractions called Braxton Hicks contractions. These false labor contractions are sometimes confused with true labor. What are Braxton Hicks contractions? Braxton Hicks contractions are tightening movements that occur in the muscles of the uterus before labor. Unlike true labor contractions, these contractions do not result in opening (dilation) and thinning of the cervix. Toward the end of pregnancy (32-34 weeks), Braxton Hicks contractions can happen more often and may become stronger. These contractions are sometimes difficult to tell apart from true labor because they can be very uncomfortable. You should not feel embarrassed if you go to the hospital with false labor. Sometimes, the only way to tell if you are in true labor is for your health care provider to look for changes in the cervix. The health care provider will do a physical exam and may monitor your contractions. If you are not in true labor, the exam should show that your cervix is not dilating and your water has not broken. If there are no other health problems associated with your pregnancy, it is completely safe for you to be sent home with false labor. You may continue to have Braxton Hicks contractions until you go into true labor. How to tell the difference between true labor and false labor True labor  Contractions last 30-70 seconds.  Contractions become very regular.  Discomfort is usually felt in the top of the uterus, and it spreads to the lower abdomen and low back.  Contractions do not go away with walking.  Contractions usually become more intense and increase in frequency.  The cervix dilates and gets thinner. False labor  Contractions are usually shorter and not as strong as true labor contractions.  Contractions are usually irregular.  Contractions  are often felt in the front of the lower abdomen and in the groin.  Contractions may go away when you walk around or change positions while lying down.  Contractions get weaker and are shorter-lasting as time goes on.  The cervix usually does not dilate or become thin. Follow these instructions at home:   Take over-the-counter and prescription medicines only as told by your health care provider.  Keep up with your usual exercises and follow other instructions from your health care provider.  Eat and drink lightly if you think you are going into labor.  If Braxton Hicks contractions are making you uncomfortable: ? Change your position from lying down or resting to walking, or change from walking to resting. ? Sit and rest in a tub of warm water. ? Drink enough fluid to keep your urine pale yellow. Dehydration may cause these contractions. ? Do slow and deep breathing several times an hour.  Keep all follow-up prenatal visits as told by your health care provider. This is important. Contact a health care provider if:  You have a fever.  You have continuous pain in your abdomen. Get help right away if:  Your contractions become stronger, more regular, and closer together.  You have fluid leaking or gushing from your vagina.  You pass blood-tinged mucus (bloody show).  You have bleeding from your vagina.  You have low back pain that you never had before.  You feel your baby's head pushing down and causing pelvic pressure.  Your baby is not moving inside you as much as it used to. Summary  Contractions that occur before labor are   called Braxton Hicks contractions, false labor, or practice contractions.  Braxton Hicks contractions are usually shorter, weaker, farther apart, and less regular than true labor contractions. True labor contractions usually become progressively stronger and regular, and they become more frequent.  Manage discomfort from Braxton Hicks contractions  by changing position, resting in a warm bath, drinking plenty of water, or practicing deep breathing. This information is not intended to replace advice given to you by your health care provider. Make sure you discuss any questions you have with your health care provider. Document Released: 05/09/2016 Document Revised: 10/08/2016 Document Reviewed: 05/09/2016 Elsevier Interactive Patient Education  2019 Elsevier Inc.  

## 2018-01-23 NOTE — Progress Notes (Signed)
Was unable to give urine sample today.

## 2018-01-28 ENCOUNTER — Inpatient Hospital Stay
Admission: EM | Admit: 2018-01-28 | Discharge: 2018-01-30 | DRG: 807 | Disposition: A | Discharge status: Home / Self Care | Payer: Medicaid Other | Attending: Certified Nurse Midwife | Admitting: Certified Nurse Midwife

## 2018-01-28 ENCOUNTER — Ambulatory Visit (INDEPENDENT_AMBULATORY_CARE_PROVIDER_SITE_OTHER): Payer: Medicaid Other | Admitting: Certified Nurse Midwife

## 2018-01-28 ENCOUNTER — Other Ambulatory Visit: Payer: Self-pay

## 2018-01-28 VITALS — BP 117/68 | HR 98 | Wt 248.2 lb

## 2018-01-28 DIAGNOSIS — Z3A39 39 weeks gestation of pregnancy: Secondary | ICD-10-CM

## 2018-01-28 DIAGNOSIS — O99214 Obesity complicating childbirth: Secondary | ICD-10-CM | POA: Diagnosis present

## 2018-01-28 DIAGNOSIS — Z3493 Encounter for supervision of normal pregnancy, unspecified, third trimester: Secondary | ICD-10-CM | POA: Diagnosis not present

## 2018-01-28 DIAGNOSIS — Z3483 Encounter for supervision of other normal pregnancy, third trimester: Secondary | ICD-10-CM | POA: Diagnosis present

## 2018-01-28 LAB — POCT URINALYSIS DIPSTICK OB
Bilirubin, UA: NEGATIVE
Blood, UA: NEGATIVE
Glucose, UA: NEGATIVE
Ketones, UA: NEGATIVE
Leukocytes, UA: NEGATIVE
Nitrite, UA: NEGATIVE
POC,PROTEIN,UA: NEGATIVE
Spec Grav, UA: 1.015 (ref 1.010–1.025)
Urobilinogen, UA: 0.2 E.U./dL
pH, UA: 7 (ref 5.0–8.0)

## 2018-01-28 MED ORDER — SOD CITRATE-CITRIC ACID 500-334 MG/5ML PO SOLN: 30.0000 mL | ORAL | Status: DC | PRN

## 2018-01-28 MED ORDER — LIDOCAINE HCL (PF) 1 % IJ SOLN
INTRAMUSCULAR | Status: AC
  Filled 2018-01-28: qty 30

## 2018-01-28 MED ORDER — ONDANSETRON HCL 4 MG/2ML IJ SOLN: 4.0000 mg | Freq: Four times a day (QID) | INTRAMUSCULAR | Status: DC | PRN

## 2018-01-28 MED ORDER — FLEET ENEMA 7-19 GM/118ML RE ENEM: 1.0000 | ENEMA | Freq: Every day | RECTAL | Status: DC | PRN

## 2018-01-28 MED ORDER — BUTORPHANOL TARTRATE 1 MG/ML IJ SOLN
1.0000 mg | INTRAMUSCULAR | Status: DC | PRN
  Administered 2018-01-29: 1 mg via INTRAVENOUS
  Filled 2018-01-28: qty 1

## 2018-01-28 MED ORDER — OXYTOCIN 40 UNITS IN NORMAL SALINE INFUSION - SIMPLE MED: 2.5000 [IU]/h | INTRAVENOUS | Status: DC

## 2018-01-28 MED ORDER — OXYCODONE-ACETAMINOPHEN 5-325 MG PO TABS: 2.0000 | ORAL_TABLET | ORAL | Status: DC | PRN

## 2018-01-28 MED ORDER — OXYTOCIN 40 UNITS IN NORMAL SALINE INFUSION - SIMPLE MED
1.0000 m[IU]/min | INTRAVENOUS | Status: DC
  Administered 2018-01-29: 2 m[IU]/min via INTRAVENOUS

## 2018-01-28 MED ORDER — LIDOCAINE HCL (PF) 1 % IJ SOLN: 30.0000 mL | INTRAMUSCULAR | Status: DC | PRN

## 2018-01-28 MED ORDER — MISOPROSTOL 200 MCG PO TABS
ORAL_TABLET | ORAL | Status: AC
  Filled 2018-01-28: qty 4

## 2018-01-28 MED ORDER — TERBUTALINE SULFATE 1 MG/ML IJ SOLN: 0.2500 mg | Freq: Once | INTRAMUSCULAR | Status: DC | PRN

## 2018-01-28 MED ORDER — ZOLPIDEM TARTRATE 5 MG PO TABS: 5.0000 mg | ORAL_TABLET | Freq: Every evening | ORAL | Status: DC | PRN

## 2018-01-28 MED ORDER — LACTATED RINGERS IV SOLN: 500.0000 mL | INTRAVENOUS | Status: DC | PRN

## 2018-01-28 MED ORDER — LACTATED RINGERS IV SOLN
INTRAVENOUS | Status: DC
  Administered 2018-01-28: via INTRAVENOUS

## 2018-01-28 MED ORDER — OXYTOCIN 40 UNITS IN NORMAL SALINE INFUSION - SIMPLE MED
INTRAVENOUS | Status: AC
  Administered 2018-01-29: 2 m[IU]/min via INTRAVENOUS
  Filled 2018-01-28: qty 1000

## 2018-01-28 MED ORDER — AMMONIA AROMATIC IN INHA
RESPIRATORY_TRACT | Status: AC
  Filled 2018-01-28: qty 10

## 2018-01-28 MED ORDER — ACETAMINOPHEN 325 MG PO TABS
650.0000 mg | ORAL_TABLET | ORAL | Status: DC | PRN
  Administered 2018-01-29: 650 mg via ORAL
  Filled 2018-01-28: qty 2

## 2018-01-28 MED ORDER — OXYTOCIN 10 UNIT/ML IJ SOLN
INTRAMUSCULAR | Status: AC
  Filled 2018-01-28: qty 2

## 2018-01-28 MED ORDER — OXYTOCIN BOLUS FROM INFUSION
500.0000 mL | Freq: Once | INTRAVENOUS | Status: AC
  Administered 2018-01-29: 500 mL via INTRAVENOUS

## 2018-01-28 MED ORDER — OXYCODONE-ACETAMINOPHEN 5-325 MG PO TABS: 1.0000 | ORAL_TABLET | ORAL | Status: DC | PRN

## 2018-01-28 NOTE — Patient Instructions (Signed)
Braxton Hicks Contractions Contractions of the uterus can occur throughout pregnancy, but they are not always a sign that you are in labor. You may have practice contractions called Braxton Hicks contractions. These false labor contractions are sometimes confused with true labor. What are Braxton Hicks contractions? Braxton Hicks contractions are tightening movements that occur in the muscles of the uterus before labor. Unlike true labor contractions, these contractions do not result in opening (dilation) and thinning of the cervix. Toward the end of pregnancy (32-34 weeks), Braxton Hicks contractions can happen more often and may become stronger. These contractions are sometimes difficult to tell apart from true labor because they can be very uncomfortable. You should not feel embarrassed if you go to the hospital with false labor. Sometimes, the only way to tell if you are in true labor is for your health care provider to look for changes in the cervix. The health care provider will do a physical exam and may monitor your contractions. If you are not in true labor, the exam should show that your cervix is not dilating and your water has not broken. If there are no other health problems associated with your pregnancy, it is completely safe for you to be sent home with false labor. You may continue to have Braxton Hicks contractions until you go into true labor. How to tell the difference between true labor and false labor True labor  Contractions last 30-70 seconds.  Contractions become very regular.  Discomfort is usually felt in the top of the uterus, and it spreads to the lower abdomen and low back.  Contractions do not go away with walking.  Contractions usually become more intense and increase in frequency.  The cervix dilates and gets thinner. False labor  Contractions are usually shorter and not as strong as true labor contractions.  Contractions are usually irregular.  Contractions  are often felt in the front of the lower abdomen and in the groin.  Contractions may go away when you walk around or change positions while lying down.  Contractions get weaker and are shorter-lasting as time goes on.  The cervix usually does not dilate or become thin. Follow these instructions at home:   Take over-the-counter and prescription medicines only as told by your health care provider.  Keep up with your usual exercises and follow other instructions from your health care provider.  Eat and drink lightly if you think you are going into labor.  If Braxton Hicks contractions are making you uncomfortable: ? Change your position from lying down or resting to walking, or change from walking to resting. ? Sit and rest in a tub of warm water. ? Drink enough fluid to keep your urine pale yellow. Dehydration may cause these contractions. ? Do slow and deep breathing several times an hour.  Keep all follow-up prenatal visits as told by your health care provider. This is important. Contact a health care provider if:  You have a fever.  You have continuous pain in your abdomen. Get help right away if:  Your contractions become stronger, more regular, and closer together.  You have fluid leaking or gushing from your vagina.  You pass blood-tinged mucus (bloody show).  You have bleeding from your vagina.  You have low back pain that you never had before.  You feel your baby's head pushing down and causing pelvic pressure.  Your baby is not moving inside you as much as it used to. Summary  Contractions that occur before labor are   called Braxton Hicks contractions, false labor, or practice contractions.  Braxton Hicks contractions are usually shorter, weaker, farther apart, and less regular than true labor contractions. True labor contractions usually become progressively stronger and regular, and they become more frequent.  Manage discomfort from Braxton Hicks contractions  by changing position, resting in a warm bath, drinking plenty of water, or practicing deep breathing. This information is not intended to replace advice given to you by your health care provider. Make sure you discuss any questions you have with your health care provider. Document Released: 05/09/2016 Document Revised: 10/08/2016 Document Reviewed: 05/09/2016 Elsevier Interactive Patient Education  2019 Elsevier Inc.  

## 2018-01-28 NOTE — OB Triage Note (Signed)
Pt is a 33 y/o G6P4 at [redacted]w[redacted]d with c/o ctx q64min. Pt stated an office today and was 4cm dilated. Pt states ctx became consisitent roughly 3pm and rates pain at a 4/10. Pt denies LOF and vaginal bleeding. Pt states positive fetal movement. Monitors applied and assessing. Initial FHT 145

## 2018-01-28 NOTE — H&P (Signed)
Obstetric History and Physical  Krystal Harrison is a 33 y.o. [phone] with IUP at [redacted]w[redacted]d presenting with pelvic pressure and contractions for the last three (3) days.   Patient states she has been having regular, every five (5) minutes contractions, none vaginal bleeding, intact membranes, with active fetal movement.    Denies difficulty breathing or respiratory distress, chest pain, dysuria, and leg pain or swelling.   Prenatal Course  Source of Care: [facility]-initial visit at 9+5 weeks, total visits: 13  Pregnancy complications or risks: Excessive weight gain in pregnancy, Rh positive  Prenatal labs and studies:  ABO, Rh: A/Positive/-- 2022-08-01 1123)  Antibody: Negative 08/01/22 1123)  Rubella: 2.93 08/01/2022 1123)  RPR: Non Reactive (11/08 1116)   HBsAg: Negative 08/01/2022 1123)   HIV: Non Reactive 01-Aug-2022 1123)   ZGY:FVCBSWHQ (12/27 0000)   1 hr Glucola: 63 (09/11 1050)  1 hr Glucola: 115 (11/08 1116)  Genetic screening: Low risk female  Anatomy US: Complete, normal (09/11 0930)  Past Medical History:  Diagnosis Date  . Complication of anesthesia    pt. reports no knowledge of complications  . Herpes genitalia    last outbreak 1-2 yrs ago    Past Surgical History:  Procedure Laterality Date  . EYE SURGERY    . EYE SURGERY Bilateral     OB History  Gravida Para Term Preterm AB Living  6 4 4  0 1 4  SAB TAB Ectopic Multiple Live Births  1 0 0 0 4    # Outcome Date GA Lbr Len/2nd Weight Sex Delivery Anes PTL Lv  6 Current           5 Term 08/29/15 [redacted]w[redacted]d 06:41 / 00:02 3680 g M Vag-Spont None  LIV  4 SAB 2016        FD  3 Term 09/12/11 [redacted]w[redacted]d  3969 g F Vag-Spont  Y LIV  2 Term 12/31/07 [redacted]w[redacted]d  3572 g M Vag-Spont  N LIV  1 Term 09/07/04 [redacted]w[redacted]d  3600 g F Vag-Spont   LIV    Obstetric Comments  09/12/2011 pt leaking fluid at 32wks, hospitalized until delivery at 38wks.    Social History   Socioeconomic History  . Marital status: Single    Spouse name: Not on file  .  Number of children: Not on file  . Years of education: Not on file  . Highest education level: Not on file  Occupational History  . Not on file  Social Needs  . Financial resource strain: Not hard at all  . Food insecurity:    Worry: Never true    Inability: Never true  . Transportation needs:    Medical: No    Non-medical: No  Tobacco Use  . Smoking status: Never Smoker  . Smokeless tobacco: Never Used  Substance and Sexual Activity  . Alcohol use: No  . Drug use: No  . Sexual activity: Yes    Birth control/protection: None    Comment: Possibly BTL   Lifestyle  . Physical activity:    Days per week: 0 days    Minutes per session: 0 min  . Stress: Not at all  Relationships  . Social connections:    Talks on phone: More than three times a week    Gets together: More than three times a week    Attends religious service: Never    Active member of club or organization: No    Attends meetings of clubs or organizations: Never    Relationship status:  Not on file  Other Topics Concern  . Not on file  Social History Narrative  . Not on file    Family History  Problem Relation Age of Onset  . Diabetes Mother   . Anesthesia problems Neg Hx     Medications Prior to Admission  Medication Sig Dispense Refill Last Dose  . acetaminophen (TYLENOL) 500 MG tablet Take 500 mg by mouth every 6 (six) hours as needed.   01/27/2018 at Unknown time  . Acetaminophen-Codeine (TYLENOL/CODEINE #3) 300-30 MG tablet Take 1 tablet by mouth every 4 (four) hours as needed for pain.   Past Week at Unknown time  . Prenatal-DSS-FeCb-FeGl-FA (CITRANATAL BLOOM) 90-1 MG TABS Take 1 tablet by mouth daily. 30 tablet 11 Past Week at Unknown time    No Known Allergies  Review of Systems: Negative except for what is mentioned in HPI.  Physical Exam:  Temp:  [98.3 F (36.8 C)] 98.3 F (36.8 C) (01/22 1900) Pulse Rate:  [98-109] 109 (01/22 1900) Resp:  [16] 16 (01/22 1900) BP: (117-140)/(68) 140/68  (01/22 1900) Weight:  [112.6 kg-112.9 kg] 112.9 kg (01/22 1900)  GENERAL: Well-developed, well-nourished female in no acute distress.   LUNGS: Clear to auscultation bilaterally.   HEART: Regular rate and rhythm.  ABDOMEN: Soft, nontender, nondistended, gravid.  EXTREMITIES: Nontender, no edema, 2+ distal pulses.  PELVIC EXAM: Vulva free of lesions or masses  Cervical Exam: Dilation: 5 Effacement (%): 60 Cervical Position: Posterior Station: -2 Presentation: Vertex Exam by:: Diego Cory CNM   AROM: clear fluid, moderate amount  FHT:  Baseline rate 135 bpm   Variability moderate  Accelerations present   Decelerations none  Contractions: Every three (3) to eight (8) minutes, soft resting tone   Pertinent Labs/Studies:    Results for orders placed or performed during the hospital encounter of 01/28/18 (from the past 24 hour(s))  CBC     Status: Abnormal   Collection Time: 01/28/18 11:51 PM  Result Value Ref Range   WBC 15.9 (H) 4.0 - 10.5 K/uL   RBC 3.98 3.87 - 5.11 MIL/uL   Hemoglobin 9.6 (L) 12.0 - 15.0 g/dL   HCT 32.6 (L) 36.0 - 46.0 %   MCV 81.9 80.0 - 100.0 fL   MCH 24.1 (L) 26.0 - 34.0 pg   MCHC 29.4 (L) 30.0 - 36.0 g/dL   RDW 17.2 (H) 11.5 - 15.5 %   Platelets 262 150 - 400 K/uL   nRBC 0.1 0.0 - 0.2 %    Assessment :  Krystal Harrison is a 33 y.o. A4T3646 at [redacted]w[redacted]d being admitted for augmentation of prolonged latent phase of labor, Rh positive, GBS negative  FHR Category I  Plan:  Admit to birthing suites, see orders.   Induction/Augmentation per protocol.  Reviewed red flag symptoms and when to call.   Delivery plan: Hopeful for vaginal delivery.  Dr. Amalia Hailey notified of admission and plan of care.    Diona Fanti, CNM [facility], [facility] 01/29/18 12:07 AM

## 2018-01-28 NOTE — Progress Notes (Signed)
Pt presents today for labor evaluation . She has constant pressure and pain on right side. Feels good movement. Denies loss of fluid. SVE 4 cm 50% -3 station. Induction scheduled for Saaturday morning midnight. Labor precautions reviewed. Follow up as scheduled.   Philip Aspen, CNM

## 2018-01-29 ENCOUNTER — Inpatient Hospital Stay: Payer: Medicaid Other | Admitting: Anesthesiology

## 2018-01-29 ENCOUNTER — Other Ambulatory Visit: Payer: Self-pay

## 2018-01-29 ENCOUNTER — Encounter: Payer: Self-pay | Admitting: Anesthesiology

## 2018-01-29 DIAGNOSIS — Z3A39 39 weeks gestation of pregnancy: Secondary | ICD-10-CM

## 2018-01-29 LAB — CBC
HCT: 32.6 % — ABNORMAL LOW (ref 36.0–46.0)
Hemoglobin: 9.6 g/dL — ABNORMAL LOW (ref 12.0–15.0)
MCH: 24.1 pg — ABNORMAL LOW (ref 26.0–34.0)
MCHC: 29.4 g/dL — ABNORMAL LOW (ref 30.0–36.0)
MCV: 81.9 fL (ref 80.0–100.0)
Platelets: 262 10*3/uL (ref 150–400)
RBC: 3.98 MIL/uL (ref 3.87–5.11)
RDW: 17.2 % — ABNORMAL HIGH (ref 11.5–15.5)
WBC: 15.9 10*3/uL — ABNORMAL HIGH (ref 4.0–10.5)
nRBC: 0.1 % (ref 0.0–0.2)

## 2018-01-29 LAB — TYPE AND SCREEN
ABO/RH(D): A POS
Antibody Screen: NEGATIVE

## 2018-01-29 MED ORDER — DIPHENHYDRAMINE HCL 50 MG/ML IJ SOLN
12.5000 mg | INTRAMUSCULAR | Status: DC | PRN
Start: 2018-01-29 — End: 2018-01-29

## 2018-01-29 MED ORDER — METHYLERGONOVINE MALEATE 0.2 MG/ML IJ SOLN: 0.2000 mg | INTRAMUSCULAR | Status: DC | PRN

## 2018-01-29 MED ORDER — WITCH HAZEL-GLYCERIN EX PADS: 1.0000 "application " | MEDICATED_PAD | CUTANEOUS | Status: DC | PRN

## 2018-01-29 MED ORDER — PRENATAL MULTIVITAMIN CH
1.0000 | ORAL_TABLET | Freq: Every day | ORAL | Status: DC
  Administered 2018-01-29 – 2018-01-30 (×2): 1 via ORAL
  Filled 2018-01-29 (×2): qty 1

## 2018-01-29 MED ORDER — SODIUM CHLORIDE 0.9% FLUSH: 3.0000 mL | Freq: Two times a day (BID) | INTRAVENOUS | Status: DC

## 2018-01-29 MED ORDER — DIBUCAINE 1 % RE OINT: 1.0000 "application " | TOPICAL_OINTMENT | RECTAL | Status: DC | PRN

## 2018-01-29 MED ORDER — PHENYLEPHRINE 40 MCG/ML (10ML) SYRINGE FOR IV PUSH (FOR BLOOD PRESSURE SUPPORT)
80.0000 ug | PREFILLED_SYRINGE | INTRAVENOUS | Status: DC | PRN
  Filled 2018-01-29: qty 10

## 2018-01-29 MED ORDER — FENTANYL 2.5 MCG/ML W/ROPIVACAINE 0.15% IN NS 100 ML EPIDURAL (ARMC)
12.0000 mL/h | EPIDURAL | Status: DC
  Administered 2018-01-29: 12 mL/h via EPIDURAL

## 2018-01-29 MED ORDER — EPHEDRINE 5 MG/ML INJ
10.0000 mg | INTRAVENOUS | Status: DC | PRN
  Filled 2018-01-29: qty 2

## 2018-01-29 MED ORDER — IBUPROFEN 600 MG PO TABS
600.0000 mg | ORAL_TABLET | Freq: Four times a day (QID) | ORAL | Status: DC
  Administered 2018-01-29 – 2018-01-30 (×5): 600 mg via ORAL
  Filled 2018-01-29 (×5): qty 1

## 2018-01-29 MED ORDER — SENNOSIDES-DOCUSATE SODIUM 8.6-50 MG PO TABS
2.0000 | ORAL_TABLET | ORAL | Status: DC
  Administered 2018-01-30: 2 via ORAL
  Filled 2018-01-29: qty 2

## 2018-01-29 MED ORDER — METHYLERGONOVINE MALEATE 0.2 MG PO TABS: 0.2000 mg | ORAL_TABLET | ORAL | Status: DC | PRN

## 2018-01-29 MED ORDER — COCONUT OIL OIL
1.0000 "application " | TOPICAL_OIL | Status: DC | PRN
  Administered 2018-01-29: 1 via TOPICAL
  Filled 2018-01-29: qty 120

## 2018-01-29 MED ORDER — LACTATED RINGERS IV SOLN: 500.0000 mL | Freq: Once | INTRAVENOUS | Status: DC

## 2018-01-29 MED ORDER — BENZOCAINE-MENTHOL 20-0.5 % EX AERO: 1.0000 "application " | INHALATION_SPRAY | CUTANEOUS | Status: DC | PRN

## 2018-01-29 MED ORDER — ONDANSETRON HCL 4 MG/2ML IJ SOLN: 4.0000 mg | INTRAMUSCULAR | Status: DC | PRN

## 2018-01-29 MED ORDER — SODIUM CHLORIDE 0.9 % IV SOLN: 250.0000 mL | INTRAVENOUS | Status: DC | PRN

## 2018-01-29 MED ORDER — LIDOCAINE-EPINEPHRINE (PF) 1.5 %-1:200000 IJ SOLN
INTRAMUSCULAR | Status: DC | PRN
  Administered 2018-01-29: 3 mL via EPIDURAL

## 2018-01-29 MED ORDER — FENTANYL 2.5 MCG/ML W/ROPIVACAINE 0.15% IN NS 100 ML EPIDURAL (ARMC)
EPIDURAL | Status: AC
  Filled 2018-01-29: qty 100

## 2018-01-29 MED ORDER — SIMETHICONE 80 MG PO CHEW: 80.0000 mg | CHEWABLE_TABLET | ORAL | Status: DC | PRN

## 2018-01-29 MED ORDER — ONDANSETRON HCL 4 MG PO TABS: 4.0000 mg | ORAL_TABLET | ORAL | Status: DC | PRN

## 2018-01-29 MED ORDER — SODIUM CHLORIDE 0.9 % IV SOLN
INTRAVENOUS | Status: DC | PRN
  Administered 2018-01-29 (×2): 5 mL via EPIDURAL

## 2018-01-29 MED ORDER — DIPHENHYDRAMINE HCL 25 MG PO CAPS: 25.0000 mg | ORAL_CAPSULE | Freq: Four times a day (QID) | ORAL | Status: DC | PRN

## 2018-01-29 MED ORDER — LIDOCAINE HCL (PF) 1 % IJ SOLN
INTRAMUSCULAR | Status: DC | PRN
  Administered 2018-01-29: 1 mL via INTRADERMAL

## 2018-01-29 MED ORDER — OXYTOCIN 40 UNITS IN NORMAL SALINE INFUSION - SIMPLE MED
INTRAVENOUS | Status: AC
  Administered 2018-01-29: 15:00:00
  Filled 2018-01-29: qty 1000

## 2018-01-29 MED ORDER — ACETAMINOPHEN 325 MG PO TABS
650.0000 mg | ORAL_TABLET | ORAL | Status: DC | PRN
  Administered 2018-01-29: 650 mg via ORAL
  Filled 2018-01-29: qty 2

## 2018-01-29 MED ORDER — SODIUM CHLORIDE 0.9% FLUSH: 3.0000 mL | INTRAVENOUS | Status: DC | PRN

## 2018-01-29 NOTE — Lactation Note (Signed)
This note was copied from a baby's chart. Lactation Consultation Note  Patient Name: Krystal Harrison Today's Date: 01/29/2018     Maternal Data    Feeding Feeding Type: Breast Fed  LATCH Score                   Interventions    Lactation Tools Discussed/Used     Consult Status  Mother states that this is her 5th baby and she has breast-fed her children for at least 2-2.5 years. Mother states that infant is latching well and denies any concerns. [name] left name and number with mother to call if she has any additional questions.    [name] 04/11/5911, 12:52 PM

## 2018-01-29 NOTE — Anesthesia Procedure Notes (Signed)
Epidural Patient location during procedure: OB Start time: 01/29/2018 4:00 AM End time: 01/29/2018 4:06 AM  Staffing Anesthesiologist: Caleyah Jr, Precious Haws, MD Performed: anesthesiologist   Preanesthetic Checklist Completed: patient identified, site marked, surgical consent, pre-op evaluation, timeout performed, IV checked, risks and benefits discussed and monitors and equipment checked  Epidural Patient position: sitting Prep: ChloraPrep Patient monitoring: heart rate, continuous pulse ox and blood pressure Approach: midline Location: L3-L4 Injection technique: LOR saline  Needle:  Needle type: Tuohy  Needle gauge: 17 G Needle length: 9 cm and 9 Needle insertion depth: 9 cm Catheter type: closed end flexible Catheter size: 19 Gauge Catheter at skin depth: 15 cm Test dose: negative and 1.5% lidocaine with Epi 1:200 K  Assessment Sensory level: T10 Events: blood not aspirated, injection not painful, no injection resistance, negative IV test and no paresthesia  Additional Notes 1 attempt Pt. Evaluated and documentation done after procedure finished. Patient identified. Risks/Benefits/Options discussed with patient including but not limited to bleeding, infection, nerve damage, paralysis, failed block, incomplete pain control, headache, blood pressure changes, nausea, vomiting, reactions to medication both or allergic, itching and postpartum back pain. Confirmed with bedside nurse the patient's most recent platelet count. Confirmed with patient that they are not currently taking any anticoagulation, have any bleeding history or any family history of bleeding disorders. Patient expressed understanding and wished to proceed. All questions were answered. Sterile technique was used throughout the entire procedure. Please see nursing notes for vital signs. Test dose was given through epidural catheter and negative prior to continuing to dose epidural or start infusion. Warning signs of  high block given to the patient including shortness of breath, tingling/numbness in hands, complete motor block, or any concerning symptoms with instructions to call for help. Patient was given instructions on fall risk and not to get out of bed. All questions and concerns addressed with instructions to call with any issues or inadequate analgesia.   Patient tolerated the insertion well without immediate complications.Reason for block:procedure for pain

## 2018-01-29 NOTE — Progress Notes (Signed)
Patient ID: Krystal Harrison, female   DOB: 14-Nov-1985, 33 y.o.   MRN: [id number]  Krystal Harrison is a 33 y.o. K3T2481 at [redacted]w[redacted]d by LMP admitted for augmentation of prolonged latent phase of labor  Subjective:  Patient in bed, breathing and moaning through contractions. Reports intermittent increased pelvic pressure. Family members at bedside for continuous labor support.   Denies difficulty breathing or respiratory distress, chest pain, vaginal bleeding, dysuria, and leg pain or swelling.   Objective:  Temp:  [98.3 F (36.8 C)] 98.3 F (36.8 C) (01/22 1900) Pulse Rate:  [98-109] 104 (01/22 2000) Resp:  [16] 16 (01/22 1900) BP: (117-140)/(68-73) 124/73 (01/22 2000) Weight:  [112.6 kg-112.9 kg] 112.9 kg (01/22 1900)  Fetal Wellbeing:  Category I  UC:   regular, every two (2) to four (4) minutes; soft resting tone; pitocin infusing at 6 mu/min  SVE:   Dilation: 7 Effacement (%): 80 Station: -2, -1 Exam by:: Diego Cory, CNM  Labs:  Lab Results  Component Value Date   WBC 15.9 (H) 01/28/2018   HGB 9.6 (L) 01/28/2018   HCT 32.6 (L) 01/28/2018   MCV 81.9 01/28/2018   PLT 262 01/28/2018    Assessment:  Krystal Harrison is a 33 y.o. Y5T0931 at [redacted]w[redacted]d admitted for augmentation of prolonged latent phase of labor, Rh positive, GBS negative  FHR Category I  Plan:  Continue orders as written. Reassess as needed.   Room prepared for second stage.    Diona Fanti, CNM [facility], [facility] 01/29/2018, 2:03 AM

## 2018-01-29 NOTE — Anesthesia Preprocedure Evaluation (Signed)
Anesthesia Evaluation  Patient identified by MRN, date of birth, ID band Patient awake    Reviewed: Allergy & Precautions, H&P , NPO status , Patient's Chart, lab work & pertinent test results  History of Anesthesia Complications Negative for: history of anesthetic complications  Airway Mallampati: III  TM Distance: <3 FB Neck ROM: full    Dental  (+) Chipped, Poor Dentition   Pulmonary neg pulmonary ROS,           Cardiovascular Exercise Tolerance: Good (-) hypertensionnegative cardio ROS       Neuro/Psych    GI/Hepatic negative GI ROS,   Endo/Other  Morbid obesity  Renal/GU   negative genitourinary   Musculoskeletal   Abdominal   Peds  Hematology negative hematology ROS (+)   Anesthesia Other Findings Past Medical History: No date: Herpes genitalia     Comment:  last outbreak 1-2 yrs ago  Past Surgical History: No date: EYE SURGERY No date: EYE SURGERY; Bilateral  BMI    Body Mass Index:  41.44 kg/m      Reproductive/Obstetrics (+) Pregnancy                             Anesthesia Physical Anesthesia Plan  ASA: III  Anesthesia Plan: Epidural   Post-op Pain Management:    Induction:   PONV Risk Score and Plan:   Airway Management Planned:   Additional Equipment:   Intra-op Plan:   Post-operative Plan:   Informed Consent: I have reviewed the patients History and Physical, chart, labs and discussed the procedure including the risks, benefits and alternatives for the proposed anesthesia with the patient or authorized representative who has indicated his/her understanding and acceptance.       Plan Discussed with: Anesthesiologist  Anesthesia Plan Comments:         Anesthesia Quick Evaluation

## 2018-01-30 ENCOUNTER — Encounter: Payer: Medicaid Other | Admitting: Obstetrics and Gynecology

## 2018-01-30 LAB — CBC
HCT: 28.5 % — ABNORMAL LOW (ref 36.0–46.0)
Hemoglobin: 8.6 g/dL — ABNORMAL LOW (ref 12.0–15.0)
MCH: 24.5 pg — ABNORMAL LOW (ref 26.0–34.0)
MCHC: 30.2 g/dL (ref 30.0–36.0)
MCV: 81.2 fL (ref 80.0–100.0)
Platelets: 254 10*3/uL (ref 150–400)
RBC: 3.51 MIL/uL — ABNORMAL LOW (ref 3.87–5.11)
RDW: 17.2 % — ABNORMAL HIGH (ref 11.5–15.5)
WBC: 17.9 10*3/uL — ABNORMAL HIGH (ref 4.0–10.5)
nRBC: 0 % (ref 0.0–0.2)

## 2018-01-30 LAB — RPR: RPR Ser Ql: NONREACTIVE

## 2018-01-30 MED ORDER — IBUPROFEN 600 MG PO TABS: 600.0000 mg | ORAL_TABLET | Freq: Four times a day (QID) | ORAL | 0 refills | Status: DC

## 2018-01-30 MED ORDER — DOCUSATE SODIUM 50 MG PO CAPS: 50.0000 mg | ORAL_CAPSULE | Freq: Two times a day (BID) | ORAL | 0 refills | Status: DC

## 2018-01-30 NOTE — Progress Notes (Signed)
Patient discharged home with infant. Discharge instructions and prescriptions given and reviewed with patient. Patient verbalized understanding. Will be escorted out by auxillary.  

## 2018-01-30 NOTE — Anesthesia Postprocedure Evaluation (Signed)
Anesthesia Post Note  Patient: Krystal Harrison  Procedure(s) Performed: AN AD [place]  Patient location during evaluation: Mother Baby Anesthesia Type: Epidural Level of consciousness: awake and alert and oriented Pain management: pain level controlled Vital Signs Assessment: post-procedure vital signs reviewed and stable Respiratory status: spontaneous breathing Cardiovascular status: stable Postop Assessment: patient able to bend at knees, no apparent nausea or vomiting, able to ambulate and adequate PO intake Anesthetic complications: no     Last Vitals:  Vitals:   01/30/18 0415 01/30/18 0806  BP: 131/75 127/73  Pulse: 85 70  Resp: 20 18  Temp: 36.7 C 36.5 C  SpO2: 96% 100%    Last Pain:  Vitals:   01/30/18 0806  TempSrc: Oral  PainSc:                  Lanora Manis

## 2018-01-30 NOTE — Final Progress Note (Signed)
Discharge Day SOAP Note:  Progress Note - Vaginal Delivery  Krystal Harrison is a 33 y.o. [id number] now PP day 1 s/p Vaginal, Spontaneous . Delivery was uncomplicated  Subjective  The patient has the following complaints: has no unusual complaints  Pain is controlled with current medications.   Patient is urinating without difficulty.  She is ambulating well.     Objective  Vital signs: BP 131/75   Pulse 85   Temp 98 F (36.7 C) (Oral)   Resp 20   Ht 5\' 5"  (1.651 m)   Wt 112.9 kg   SpO2 96%   Breastfeeding Unknown   BMI 41.44 kg/m   Physical Exam: Gen: NAD Fundus Fundal Tone: Firm @ u-1  Lochia Amount: Small  Perineum Appearance: Intact, Edematous     Data Review Labs: CBC Latest Ref Rng & Units 01/30/2018 01/28/2018 11/14/2017  WBC 4.0 - 10.5 K/uL 17.9(H) 15.9(H) 11.6(H)  Hemoglobin 12.0 - 15.0 g/dL 8.6(L) 9.6(L) 9.9(L)  Hematocrit 36.0 - 46.0 % 28.5(L) 32.6(L) 30.4(L)  Platelets 150 - 400 K/uL 254 262 244   A POS  Assessment/Plan  Active Problems:   Prolonged latent phase of labor    Plan for discharge today.   Discharge Instructions: Per After Visit Summary. Activity: Advance as tolerated. Pelvic rest for 6 weeks.  Also refer to After Visit Summary Diet: Regular Medications: Allergies as of 01/30/2018   No Known Allergies     Medication List    STOP taking these medications   acetaminophen 500 MG tablet Commonly known as:  TYLENOL   TYLENOL/CODEINE #3 300-30 MG tablet Generic drug:  Acetaminophen-Codeine     TAKE these medications   CITRANATAL BLOOM 90-1 MG Tabs Take 1 tablet by mouth daily.   docusate sodium 50 MG capsule Commonly known as:  COLACE Take 1 capsule (50 mg total) by mouth 2 (two) times daily.   ibuprofen 600 MG tablet Commonly known as:  ADVIL,MOTRIN Take 1 tablet (600 mg total) by mouth every 6 (six) hours.      Outpatient follow up:  Follow-up Information    Diona Fanti, CNM. Call in 4 week(s).    Specialties:  Certified Nurse Midwife, Obstetrics and Gynecology, Radiology Why:  Please call to schedule four (4) week postpartum visit to discuss interval tubal with JML, if desired Contact information: [address] [place] [address] [phone]          Postpartum contraception: Will discuss at first office visit post-partum. She is considering the IUD or tubal ligations.   Discharged Condition: good  Discharged to: home  Newborn Data: Disposition:home with mother  Apgars: APGAR (1 MIN): 8   APGAR (5 MINS): 9   APGAR (10 MINS):    Baby Feeding: Breast    Philip Aspen, CNM  01/30/2018 8:02 AM

## 2018-01-30 NOTE — Final Progress Note (Addendum)
Discharge Summary  Date of Admission: 01/28/2018  Date of Discharge: 01/30/2018  Admitting Diagnosis: Induction of labor at [redacted]w[redacted]d  Mode of Delivery: normal spontaneous vaginal delivery                 Discharge Diagnosis: No other diagnosis   Intrapartum Procedures: epidural   Post partum procedures: none  Complications: none                      Discharge Day SOAP Note:  Progress Note - Vaginal Delivery  Krystal Harrison is a 33 y.o. [id number] now PP day 1 s/p Vaginal, Spontaneous . Delivery was uncomplicated  Subjective  The patient has the following complaints: has no unusual complaints  Pain is controlled with current medications.   Patient is urinating without difficulty.  She is ambulating well.     Objective  Vital signs: BP 131/75   Pulse 85   Temp 98 F (36.7 C) (Oral)   Resp 20   Ht 5\' 5"  (1.651 m)   Wt 112.9 kg   SpO2 96%   Breastfeeding Unknown   BMI 41.44 kg/m   Physical Exam: Gen: NAD Fundus Fundal Tone: Firm @ u-1  Lochia Amount: Small  Perineum Appearance: Intact, Edematous     Data Review Labs: CBC Latest Ref Rng & Units 01/30/2018 01/28/2018 11/14/2017  WBC 4.0 - 10.5 K/uL 17.9(H) 15.9(H) 11.6(H)  Hemoglobin 12.0 - 15.0 g/dL 8.6(L) 9.6(L) 9.9(L)  Hematocrit 36.0 - 46.0 % 28.5(L) 32.6(L) 30.4(L)  Platelets 150 - 400 K/uL 254 262 244   A POS  Assessment/Plan  Active Problems:   Prolonged latent phase of labor    Plan for discharge today.   Discharge Instructions: Per After Visit Summary. Activity: Advance as tolerated. Pelvic rest for 6 weeks.  Also refer to After Visit Summary Diet: Regular Medications: Allergies as of 01/30/2018   No Known Allergies     Medication List    STOP taking these medications   acetaminophen 500 MG tablet Commonly known as:  TYLENOL   TYLENOL/CODEINE #3 300-30 MG tablet Generic drug:  Acetaminophen-Codeine     TAKE these medications   CITRANATAL BLOOM 90-1 MG  Tabs Take 1 tablet by mouth daily.   docusate sodium 50 MG capsule Commonly known as:  COLACE Take 1 capsule (50 mg total) by mouth 2 (two) times daily.   ibuprofen 600 MG tablet Commonly known as:  ADVIL,MOTRIN Take 1 tablet (600 mg total) by mouth every 6 (six) hours.      Outpatient follow up:  Follow-up Information    Diona Fanti, CNM. Call in 4 week(s).   Specialties:  Certified Nurse Midwife, Obstetrics and Gynecology, Radiology Why:  Please call to schedule four (4) week postpartum visit to discuss interval tubal with JML, if desired Contact information: [address] [place] [address] [phone]          Postpartum contraception: Will discuss at first office visit post-partum. She is considering the IUD or tubal ligations.   Discharged Condition: good  Discharged to: home  Newborn Data: Disposition:home with mother  Apgars: APGAR (1 MIN): 8   APGAR (5 MINS): 9   APGAR (10 MINS):    Baby Feeding: Breast    Philip Aspen, CNM  01/30/2018 8:02 AM

## 2018-02-04 ENCOUNTER — Telehealth: Payer: Self-pay | Admitting: Certified Nurse Midwife

## 2018-02-04 NOTE — Telephone Encounter (Signed)
Please let Levana know that amoxicillin is fine with breastfeeding.  Thanks,  Deneise Lever

## 2018-02-04 NOTE — Telephone Encounter (Signed)
The patient called and stated that she is wanting to know if she is able to take amoxacillin while breast feeding. No other information was disclosed. Please advise.

## 2018-06-03 ENCOUNTER — Encounter: Payer: Self-pay | Admitting: Obstetrics and Gynecology

## 2018-06-03 ENCOUNTER — Other Ambulatory Visit: Payer: Self-pay

## 2018-06-03 ENCOUNTER — Other Ambulatory Visit (HOSPITAL_COMMUNITY)
Admission: RE | Admit: 2018-06-03 | Discharge: 2018-06-03 | Disposition: A | Discharge status: Home / Self Care | Payer: Medicaid Other | Source: Ambulatory Visit | Attending: Obstetrics and Gynecology | Admitting: Obstetrics and Gynecology

## 2018-06-03 ENCOUNTER — Ambulatory Visit (INDEPENDENT_AMBULATORY_CARE_PROVIDER_SITE_OTHER): Payer: Medicaid Other | Admitting: Obstetrics and Gynecology

## 2018-06-03 VITALS — BP 103/67 | HR 82 | Ht 65.0 in | Wt 217.9 lb

## 2018-06-03 DIAGNOSIS — Z6836 Body mass index (BMI) 36.0-36.9, adult: Secondary | ICD-10-CM | POA: Diagnosis not present

## 2018-06-03 DIAGNOSIS — B359 Dermatophytosis, unspecified: Secondary | ICD-10-CM

## 2018-06-03 DIAGNOSIS — Z01411 Encounter for gynecological examination (general) (routine) with abnormal findings: Secondary | ICD-10-CM | POA: Diagnosis present

## 2018-06-03 DIAGNOSIS — O9081 Anemia of the puerperium: Secondary | ICD-10-CM | POA: Diagnosis not present

## 2018-06-03 DIAGNOSIS — Z0001 Encounter for general adult medical examination with abnormal findings: Secondary | ICD-10-CM | POA: Diagnosis not present

## 2018-06-03 MED ORDER — KETOCONAZOLE 2 % EX CREA: 1.0000 "application " | TOPICAL_CREAM | Freq: Every day | CUTANEOUS | 1 refills | Status: DC

## 2018-06-03 MED ORDER — FLUCONAZOLE 150 MG PO TABS: 150.0000 mg | ORAL_TABLET | ORAL | 3 refills | Status: DC

## 2018-06-03 NOTE — Progress Notes (Signed)
Subjective:   Krystal Harrison is a 33 y.o. [phone] Caucasian female here for a routine well-woman exam.  No LMP recorded.    Current complaints: rash on neck that has spread to shoulder and back, itches a little, for about a week. Denies changes in hygiene products or being in the pool/hot tub recently, although did go to the beach last week.  Also notes vaginal itching. PCP: unknown       does desire labs  Social History: Sexual: heterosexual Marital Status: single Living situation: with family Occupation: homemaker Tobacco/alcohol: no tobacco use Illicit drugs: no history of illicit drug use  The following portions of the patient's history were reviewed and updated as appropriate: allergies, current medications, past family history, past medical history, past social history, past surgical history and problem list.  Past Medical History Past Medical History:  Diagnosis Date  . Complication of anesthesia    pt. reports no knowledge of complications  . Herpes genitalia    last outbreak 1-2 yrs ago    Past Surgical History Past Surgical History:  Procedure Laterality Date  . EYE SURGERY    . EYE SURGERY Bilateral     Gynecologic History [id number]  No LMP recorded. Contraception: abstinence Last Pap: 10/2016. Results were: normal   Obstetric History OB History  Gravida Para Term Preterm AB Living  6 5 5  0 1 5  SAB TAB Ectopic Multiple Live Births  1 0 0 0 5    # Outcome Date GA Lbr Len/2nd Weight Sex Delivery Anes PTL Lv  6 Term 01/29/18 [redacted]w[redacted]d 13:48 / 00:27 8 lb 11.7 oz (3.96 kg) F Vag-Spont None  LIV  5 Term 08/29/15 [redacted]w[redacted]d 06:41 / 00:02 8 lb 1.8 oz (3.68 kg) M Vag-Spont None  LIV  4 SAB 2016        FD  3 Term 09/12/11 [redacted]w[redacted]d  8 lb 12 oz (3.969 kg) F Vag-Spont  Y LIV  2 Term 12/31/07 [redacted]w[redacted]d  7 lb 14 oz (3.572 kg) M Vag-Spont  N LIV  1 Term 09/07/04 [redacted]w[redacted]d  7 lb 15 oz (3.6 kg) F Vag-Spont   LIV    Obstetric Comments  09/12/2011 pt leaking fluid at 32wks, hospitalized until  delivery at 38wks.    Current Medications Current Outpatient Medications on File Prior to Visit  Medication Sig Dispense Refill  . Prenatal-DSS-FeCb-FeGl-FA (CITRANATAL BLOOM) 90-1 MG TABS Take 1 tablet by mouth daily. 30 tablet 11   No current facility-administered medications on file prior to visit.     Review of Systems Patient denies any headaches, blurred vision, shortness of breath, chest pain, abdominal pain, problems with bowel movements, urination, or intercourse.  Objective:  BP 103/67   Pulse 82   Ht 5\' 5"  (1.651 m)   Wt 217 lb 14.4 oz (98.8 kg)   Breastfeeding Yes   BMI 36.26 kg/m  Physical Exam  General:  Well developed, well nourished, no acute distress. She is alert and oriented x3. 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  The vagina is normal in appearance. The cervix is bulbous, no CMT.  Thin  prep pap is done with HR HPV cotesting. Uterus is felt to be normal size, shape, and contour.  No adnexal masses or tenderness noted. Extremities:  No swelling or varicosities noted Psych:  She has a normal mood and affect  Assessment:   Healthy well-woman exam BMI 36 Tinea rash on right neck to back  Plan:  Diflucan 150mg  q3d and nizoral cream bid prescribed and instructed on use. Encouraged BC if resumes sexual activity. Labs obtained-will follow up accordingly. F/U 1 year for AE, or sooner if needed   Melody Rockney Ghee, CNM

## 2018-06-04 ENCOUNTER — Other Ambulatory Visit: Payer: Self-pay | Admitting: Obstetrics and Gynecology

## 2018-06-04 LAB — COMPREHENSIVE METABOLIC PANEL
ALT: 30 IU/L (ref 0–32)
AST: 25 IU/L (ref 0–40)
Albumin/Globulin Ratio: 1.8 (ref 1.2–2.2)
Albumin: 4.6 g/dL (ref 3.8–4.8)
Alkaline Phosphatase: 73 IU/L (ref 39–117)
BUN/Creatinine Ratio: 24 — ABNORMAL HIGH (ref 9–23)
BUN: 16 mg/dL (ref 6–20)
Bilirubin Total: 1.2 mg/dL (ref 0.0–1.2)
CO2: 20 mmol/L (ref 20–29)
Calcium: 9.3 mg/dL (ref 8.7–10.2)
Chloride: 103 mmol/L (ref 96–106)
Creatinine, Ser: 0.67 mg/dL (ref 0.57–1.00)
GFR calc Af Amer: 135 mL/min/{1.73_m2} (ref >59)
GFR calc non Af Amer: 118 mL/min/{1.73_m2} (ref >59)
Globulin, Total: 2.6 g/dL (ref 1.5–4.5)
Glucose: 74 mg/dL (ref 65–99)
Potassium: 4.1 mmol/L (ref 3.5–5.2)
Sodium: 140 mmol/L (ref 134–144)
Total Protein: 7.2 g/dL (ref 6.0–8.5)

## 2018-06-04 LAB — CBC
Hematocrit: 37 % (ref 34.0–46.6)
Hemoglobin: 12.3 g/dL (ref 11.1–15.9)
MCH: 27.5 pg (ref 26.6–33.0)
MCHC: 33.2 g/dL (ref 31.5–35.7)
MCV: 83 fL (ref 79–97)
Platelets: 271 10*3/uL (ref 150–450)
RBC: 4.47 x10E6/uL (ref 3.77–5.28)
RDW: 13.9 % (ref 11.7–15.4)
WBC: 7.7 10*3/uL (ref 3.4–10.8)

## 2018-06-04 LAB — LIPID PANEL
Chol/HDL Ratio: 3.5 ratio (ref 0.0–4.4)
Cholesterol, Total: 193 mg/dL (ref 100–199)
HDL: 55 mg/dL (ref >39)
LDL Calculated: 125 mg/dL — ABNORMAL HIGH (ref 0–99)
Triglycerides: 64 mg/dL (ref 0–149)
VLDL Cholesterol Cal: 13 mg/dL (ref 5–40)

## 2018-06-04 LAB — THYROID PANEL WITH TSH
Free Thyroxine Index: 1.9 (ref 1.2–4.9)
T3 Uptake Ratio: 25 % (ref 24–39)
T4, Total: 7.5 ug/dL (ref 4.5–12.0)
TSH: 1.93 u[IU]/mL (ref 0.450–4.500)

## 2018-06-04 LAB — VITAMIN D 25 HYDROXY (VIT D DEFICIENCY, FRACTURES): Vit D, 25-Hydroxy: 22.8 ng/mL — ABNORMAL LOW (ref 30.0–100.0)

## 2018-06-04 LAB — FERRITIN: Ferritin: 24 ng/mL (ref 15–150)

## 2018-06-04 LAB — HEMOGLOBIN A1C
Est. average glucose Bld gHb Est-mCnc: 105 mg/dL
Hgb A1c MFr Bld: 5.3 % (ref 4.8–5.6)

## 2018-06-04 MED ORDER — VITAMIN D (ERGOCALCIFEROL) 1.25 MG (50000 UNIT) PO CAPS: 50000.0000 [IU] | ORAL_CAPSULE | ORAL | 4 refills | Status: DC

## 2018-06-09 LAB — CYTOLOGY - PAP
Diagnosis: UNDETERMINED — AB
HPV: NOT DETECTED

## 2018-08-20 ENCOUNTER — Other Ambulatory Visit: Payer: Self-pay

## 2018-08-20 DIAGNOSIS — Z20822 Contact with and (suspected) exposure to covid-19: Secondary | ICD-10-CM

## 2018-08-20 NOTE — Progress Notes (Signed)
la 

## 2018-08-21 LAB — NOVEL CORONAVIRUS, NAA: SARS-CoV-2, NAA: NOT DETECTED

## 2018-08-21 LAB — SPECIMEN STATUS REPORT

## 2018-12-11 ENCOUNTER — Other Ambulatory Visit: Payer: Self-pay

## 2018-12-11 DIAGNOSIS — Z20822 Contact with and (suspected) exposure to covid-19: Secondary | ICD-10-CM

## 2018-12-14 LAB — NOVEL CORONAVIRUS, NAA: SARS-CoV-2, NAA: NOT DETECTED

## 2018-12-21 ENCOUNTER — Other Ambulatory Visit: Payer: Self-pay

## 2018-12-21 DIAGNOSIS — Z20822 Contact with and (suspected) exposure to covid-19: Secondary | ICD-10-CM

## 2018-12-23 LAB — NOVEL CORONAVIRUS, NAA: SARS-CoV-2, NAA: DETECTED — AB

## 2019-03-08 ENCOUNTER — Institutional Professional Consult (permissible substitution): Payer: Medicaid Other | Admitting: Certified Nurse Midwife

## 2019-03-15 ENCOUNTER — Other Ambulatory Visit: Payer: Self-pay

## 2019-03-15 ENCOUNTER — Encounter: Payer: Self-pay | Admitting: Certified Nurse Midwife

## 2019-03-15 ENCOUNTER — Ambulatory Visit (INDEPENDENT_AMBULATORY_CARE_PROVIDER_SITE_OTHER): Payer: Medicaid Other | Admitting: Certified Nurse Midwife

## 2019-03-15 VITALS — BP 102/77 | HR 88 | Ht 65.0 in | Wt 216.0 lb

## 2019-03-15 DIAGNOSIS — K649 Unspecified hemorrhoids: Secondary | ICD-10-CM

## 2019-03-15 MED ORDER — SITZ BATH MISC: 1.0000 "application " | Freq: Four times a day (QID) | 0 refills | Status: DC | PRN

## 2019-03-15 MED ORDER — HYDROCORTISONE ACETATE 1 % EX OINT: 1.0000 "application " | TOPICAL_OINTMENT | Freq: Two times a day (BID) | CUTANEOUS | 0 refills | Status: DC | PRN

## 2019-03-15 MED ORDER — DOCUSATE SODIUM 100 MG PO CAPS: 100.0000 mg | ORAL_CAPSULE | Freq: Two times a day (BID) | ORAL | 0 refills | Status: DC

## 2019-03-15 NOTE — Patient Instructions (Signed)

## 2019-03-15 NOTE — Progress Notes (Signed)
GYN ENCOUNTER NOTE  Subjective:       Krystal Harrison is a 34 y.o. [phone] female is here for gynecologic evaluation of the following issues:  1. Painful hemorrhoids. She has had issues on and off but have been worse the past four days. She complains of bleeding, and burning pain that is making it difficult to walk, stand , or sit. She has tried over the counter medication, tucks pads, and has sat in warm bath with little relief.     Obstetric History OB History  Gravida Para Term Preterm AB Living  6 5 5  0 1 5  SAB TAB Ectopic Multiple Live Births  1 0 0 0 5    # Outcome Date GA Lbr Len/2nd Weight Sex Delivery Anes PTL Lv  6 Term 01/29/18 [redacted]w[redacted]d 13:48 / 00:27 8 lb 11.7 oz (3.96 kg) F Vag-Spont None  LIV  5 Term 08/29/15 [redacted]w[redacted]d 06:41 / 00:02 8 lb 1.8 oz (3.68 kg) M Vag-Spont None  LIV  4 SAB 2016        FD  3 Term 09/12/11 [redacted]w[redacted]d  8 lb 12 oz (3.969 kg) F Vag-Spont  Y LIV  2 Term 12/31/07 [redacted]w[redacted]d  7 lb 14 oz (3.572 kg) M Vag-Spont  N LIV  1 Term 09/07/04 [redacted]w[redacted]d  7 lb 15 oz (3.6 kg) F Vag-Spont   LIV    Obstetric Comments  09/12/2011 pt leaking fluid at 32wks, hospitalized until delivery at 38wks.    Past Medical History:  Diagnosis Date  . Complication of anesthesia    pt. reports no knowledge of complications  . Herpes genitalia    last outbreak 1-2 yrs ago    Past Surgical History:  Procedure Laterality Date  . EYE SURGERY    . EYE SURGERY Bilateral     Current Outpatient Medications on File Prior to Visit  Medication Sig Dispense Refill  . Prenatal-DSS-FeCb-FeGl-FA (CITRANATAL BLOOM) 90-1 MG TABS Take 1 tablet by mouth daily. 30 tablet 11  . fluconazole (DIFLUCAN) 150 MG tablet Take 1 tablet (150 mg total) by mouth every 3 (three) days. For three doses (Patient not taking: Reported on 03/15/2019) 3 tablet 3  . ketoconazole (NIZORAL) 2 % cream Apply 1 application topically daily. (Patient not taking: Reported on 03/15/2019) 30 g 1  . Vitamin D, Ergocalciferol, (DRISDOL) 1.25 MG  (50000 UT) CAPS capsule Take 1 capsule (50,000 Units total) by mouth every 7 (seven) days. (Patient not taking: Reported on 03/15/2019) 12 capsule 4   No current facility-administered medications on file prior to visit.    No Known Allergies  Social History   Socioeconomic History  . Marital status: Single    Spouse name: Not on file  . Number of children: Not on file  . Years of education: Not on file  . Highest education level: Not on file  Occupational History  . Not on file  Tobacco Use  . Smoking status: Never Smoker  . Smokeless tobacco: Never Used  Substance and Sexual Activity  . Alcohol use: No  . Drug use: No  . Sexual activity: Not Currently    Birth control/protection: None    Comment: Possibly BTL   Other Topics Concern  . Not on file  Social History Narrative  . Not on file   Social Determinants of Health   Financial Resource Strain:   . Difficulty of Paying Living Expenses: Not on file  Food Insecurity:   . Worried About Charity fundraiser in the Last  Year: Not on file  . Ran Out of Food in the Last Year: Not on file  Transportation Needs:   . Lack of Transportation (Medical): Not on file  . Lack of Transportation (Non-Medical): Not on file  Physical Activity:   . Days of Exercise per Week: Not on file  . Minutes of Exercise per Session: Not on file  Stress:   . Feeling of Stress : Not on file  Social Connections:   . Frequency of Communication with Friends and Family: Not on file  . Frequency of Social Gatherings with Friends and Family: Not on file  . Attends Religious Services: Not on file  . Active Member of Clubs or Organizations: Not on file  . Attends Archivist Meetings: Not on file  . Marital Status: Not on file  Intimate Partner Violence:   . Fear of Current or Ex-Partner: Not on file  . Emotionally Abused: Not on file  . Physically Abused: Not on file  . Sexually Abused: Not on file    Family History  Problem Relation Age  of Onset  . Diabetes Mother   . Anesthesia problems Neg Hx     The following portions of the patient's history were reviewed and updated as appropriate: allergies, current medications, past family history, past medical history, past social history, past surgical history and problem list.  Review of Systems Review of Systems - Negative except as mentioned in HPI Review of Systems - General ROS: negative for - chills, fatigue, fever, hot flashes, malaise or night sweats Hematological and Lymphatic ROS: negative for - bleeding problems or swollen lymph nodes Gastrointestinal ROS: negative for - abdominal pain, blood in stools, change in bowel habits and nausea/vomiting Musculoskeletal ROS: negative for - joint pain, muscle pain or muscular weakness Genito-Urinary ROS: negative for - change in menstrual cycle, dysmenorrhea, dyspareunia, dysuria, genital discharge, genital ulcers, hematuria, incontinence, irregular/heavy menses, nocturia or pelvic painjj  Objective:   BP 102/77   Pulse 88   Ht 5\' 5"  (1.651 m)   Wt 216 lb (98 kg)   BMI 35.94 kg/m  CONSTITUTIONAL: Well-developed, well-nourished female in no acute distress.  HENT:  Normocephalic, atraumatic.  NECK: Normal range of motion, supple, no masses.  Normal thyroid.  SKIN: Skin is warm and dry. No rash noted. Not diaphoretic. No erythema. No pallor. [place]: Alert and oriented to person, place, and time. PSYCHIATRIC: Normal mood and affect. Normal behavior. Normal judgment and thought content. CARDIOVASCULAR:Not Examined RESPIRATORY: Not Examined BREASTS: Not Examined ABDOMEN: Soft, non distended; Non tender.  No Organomegaly. PELVIC:not indicated Rectum: 1 thrombosed hemorrhoid noted size of grape, 2 non inflamed hemorrhoids  MUSCULOSKELETAL: Normal range of motion. No tenderness.  No cyanosis, clubbing, or edema.   Assessment:   Hemorrhoid  Plan:   Discussed use of sitz bath, colace 2 x daily , and use of cream . Pt has  appointment with Dr.Burnet general surgery for evaluation of banding tomorrow afternoon. Follow up PRN.   Philip Aspen, CNM

## 2019-03-16 ENCOUNTER — Encounter: Payer: Medicaid Other | Admitting: Certified Nurse Midwife

## 2019-05-26 ENCOUNTER — Telehealth: Payer: Self-pay | Admitting: Certified Nurse Midwife

## 2019-05-26 NOTE — Telephone Encounter (Signed)
Patient called in wanting to be seen to be treated for strep throat. Informed patient we are an OB-GYN and that she would need to see a primary care physician. Patient didn't like that response, and hung up on front desk.

## 2019-06-01 ENCOUNTER — Encounter: Payer: Medicaid Other | Admitting: Certified Nurse Midwife

## 2019-06-16 ENCOUNTER — Encounter: Payer: Self-pay | Admitting: Certified Nurse Midwife

## 2019-06-16 ENCOUNTER — Ambulatory Visit (INDEPENDENT_AMBULATORY_CARE_PROVIDER_SITE_OTHER): Payer: Medicaid Other | Admitting: Certified Nurse Midwife

## 2019-06-16 ENCOUNTER — Other Ambulatory Visit: Payer: Self-pay

## 2019-06-16 VITALS — BP 106/76 | HR 94 | Ht 65.0 in | Wt 218.3 lb

## 2019-06-16 DIAGNOSIS — L989 Disorder of the skin and subcutaneous tissue, unspecified: Secondary | ICD-10-CM | POA: Diagnosis not present

## 2019-06-16 DIAGNOSIS — Z3009 Encounter for other general counseling and advice on contraception: Secondary | ICD-10-CM

## 2019-06-16 LAB — POCT URINE PREGNANCY: Preg Test, Ur: NEGATIVE

## 2019-06-16 MED ORDER — ETONOGESTREL-ETHINYL ESTRADIOL 0.12-0.015 MG/24HR VA RING: VAGINAL_RING | VAGINAL | 12 refills | Status: DC

## 2019-06-16 NOTE — Patient Instructions (Signed)

## 2019-06-16 NOTE — Progress Notes (Signed)
Subjective:    Krystal Harrison is a 34 y.o. female who presents for contraception counseling. The patient has no complaints today. The patient is not currently sexually active. Pertinent past medical history: none. She is interested in starting birth control or having a tubal. She is not 100% about the tubal.  Menstrual History: OB History    Gravida  6   Para  5   Term  5   Preterm  0   AB  1   Living  5     SAB  1   TAB  0   Ectopic  0   Multiple  0   Live Births  5        Obstetric Comments  09/12/2011 pt leaking fluid at 32wks, hospitalized until delivery at 38wks.        Patient's last menstrual period was 04/29/2019 (exact date).    The following portions of the patient's history were reviewed and updated as appropriate: allergies, current medications, past family history, past medical history, past social history, past surgical history and problem list.  Review of Systems Pertinent items are noted in HPI.   Objective:    No exam performed today, not indicated for [facility] counseling .   Assessment:    33 y.o., starting NuvaRing vaginal inserts, no contraindications.   Plan:    All questions answered.  pt counseled that a tubal is permanent and if she is not 100% sure that I would recommend BC. Reviewed BC options including patch, pill, ring, Depo, IUD, Nexplanon. She states she would like to use the ring. She has used it in the the past and liked it. She denies any contraindications to use.   Philip Aspen, CNM

## 2019-10-13 ENCOUNTER — Encounter: Payer: Medicaid Other | Admitting: Certified Nurse Midwife

## 2019-10-18 ENCOUNTER — Ambulatory Visit (INDEPENDENT_AMBULATORY_CARE_PROVIDER_SITE_OTHER): Payer: Medicaid Other | Admitting: Certified Nurse Midwife

## 2019-10-18 ENCOUNTER — Other Ambulatory Visit: Payer: Self-pay

## 2019-10-18 ENCOUNTER — Other Ambulatory Visit (HOSPITAL_COMMUNITY)
Admission: RE | Admit: 2019-10-18 | Discharge: 2019-10-18 | Disposition: A | Discharge status: Home / Self Care | Payer: Medicaid Other | Source: Ambulatory Visit | Attending: Certified Nurse Midwife | Admitting: Certified Nurse Midwife

## 2019-10-18 ENCOUNTER — Encounter: Payer: Self-pay | Admitting: Certified Nurse Midwife

## 2019-10-18 VITALS — BP 103/74 | HR 79 | Ht 65.0 in | Wt 215.1 lb

## 2019-10-18 DIAGNOSIS — Z113 Encounter for screening for infections with a predominantly sexual mode of transmission: Secondary | ICD-10-CM | POA: Diagnosis not present

## 2019-10-18 DIAGNOSIS — Z30019 Encounter for initial prescription of contraceptives, unspecified: Secondary | ICD-10-CM | POA: Diagnosis not present

## 2019-10-18 DIAGNOSIS — R109 Unspecified abdominal pain: Secondary | ICD-10-CM

## 2019-10-18 DIAGNOSIS — D229 Melanocytic nevi, unspecified: Secondary | ICD-10-CM | POA: Diagnosis not present

## 2019-10-18 DIAGNOSIS — R195 Other fecal abnormalities: Secondary | ICD-10-CM

## 2019-10-18 DIAGNOSIS — N898 Other specified noninflammatory disorders of vagina: Secondary | ICD-10-CM | POA: Diagnosis not present

## 2019-10-18 LAB — POCT URINE PREGNANCY: Preg Test, Ur: NEGATIVE

## 2019-10-18 MED ORDER — ETONOGESTREL-ETHINYL ESTRADIOL 0.12-0.015 MG/24HR VA RING: VAGINAL_RING | VAGINAL | 12 refills | Status: AC

## 2019-10-18 NOTE — Progress Notes (Signed)
GYN ENCOUNTER NOTE  Subjective:       Krystal Harrison is a 34 y.o. [phone] female is here for gynecologic evaluation of the following issues:  1. Birth control, pt state she never picked up the ring so it was put back 2.increased vaginal discharge, over the past several weeks , has had unprotected intercourse would like std testing 3. Stomach pain , increased stools, exposure to c diff 4. Dark mole on her left leg.    Gynecologic History Patient's last menstrual period was 09/22/2019 (approximate). Contraception: none Last Pap: 06/03/18. Results were: abnormal ASCUS Last mammogram: n/a    The pregnancy intention screening data noted above was reviewed. Potential methods of contraception were discussed. The patient elected to proceed with Vaginal Ring.    Obstetric History OB History  Gravida Para Term Preterm AB Living  6 5 5  0 1 5  SAB TAB Ectopic Multiple Live Births  1 0 0 0 5    # Outcome Date GA Lbr Len/2nd Weight Sex Delivery Anes PTL Lv  6 Term 01/29/18 [redacted]w[redacted]d 13:48 / 00:27 8 lb 11.7 oz (3.96 kg) F Vag-Spont None  LIV  5 Term 08/29/15 [redacted]w[redacted]d 06:41 / 00:02 8 lb 1.8 oz (3.68 kg) M Vag-Spont None  LIV  4 SAB 2016        FD  3 Term 09/12/11 [redacted]w[redacted]d  8 lb 12 oz (3.969 kg) F Vag-Spont  Y LIV  2 Term 12/31/07 [redacted]w[redacted]d  7 lb 14 oz (3.572 kg) M Vag-Spont  N LIV  1 Term 09/07/04 [redacted]w[redacted]d  7 lb 15 oz (3.6 kg) F Vag-Spont   LIV    Obstetric Comments  09/12/2011 pt leaking fluid at 32wks, hospitalized until delivery at 38wks.    Past Medical History:  Diagnosis Date   Complication of anesthesia    pt. reports no knowledge of complications   Herpes genitalia    last outbreak 1-2 yrs ago    Past Surgical History:  Procedure Laterality Date   EYE SURGERY     EYE SURGERY Bilateral     Current Outpatient Medications on File Prior to Visit  Medication Sig Dispense Refill   etonogestrel-ethinyl estradiol (NUVARING) 0.12-0.015 MG/24HR vaginal ring Insert vaginally and leave in place  for 3 consecutive weeks, then remove for 1 week. (Patient not taking: Reported on 10/18/2019) 1 each 12   No current facility-administered medications on file prior to visit.    No Known Allergies  Social History   Socioeconomic History   Marital status: Single    Spouse name: Not on file   Number of children: Not on file   Years of education: Not on file   Highest education level: Not on file  Occupational History   Not on file  Tobacco Use   Smoking status: Never Smoker   Smokeless tobacco: Never Used  Vaping Use   Vaping Use: Never used  Substance and Sexual Activity   Alcohol use: No   Drug use: No   Sexual activity: Not Currently    Birth control/protection: None    Comment: Possibly BTL   Other Topics Concern   Not on file  Social History Narrative   Not on file   Social Determinants of Health   Financial Resource Strain:    Difficulty of Paying Living Expenses: Not on file  Food Insecurity:    Worried About [place] in the Last Year: Not on file   Ran Out of Food in the Last Year: Not  on file  Transportation Needs:    Lack of Transportation (Medical): Not on file   Lack of Transportation (Non-Medical): Not on file  Physical Activity:    Days of Exercise per Week: Not on file   Minutes of Exercise per Session: Not on file  Stress:    Feeling of Stress : Not on file  Social Connections:    Frequency of Communication with Friends and Family: Not on file   Frequency of Social Gatherings with Friends and Family: Not on file   Attends Religious Services: Not on file   Active Member of Clubs or Organizations: Not on file   Attends Archivist Meetings: Not on file   Marital Status: Not on file  Intimate Partner Violence:    Fear of Current or Ex-Partner: Not on file   Emotionally Abused: Not on file   Physically Abused: Not on file   Sexually Abused: Not on file    Family History  Problem Relation Age of  Onset   Diabetes Mother    Anesthesia problems Neg Hx     The following portions of the patient's history were reviewed and updated as appropriate: allergies, current medications, past family history, past medical history, past social history, past surgical history and problem list.  Review of Systems Review of Systems - Negative except as mentioned in HPI Review of Systems - General ROS: negative for - chills, fatigue, fever, hot flashes, malaise or night sweats Hematological and Lymphatic ROS: negative for - bleeding problems or swollen lymph nodes Gastrointestinal ROS: negative for - abdominal pain, blood in stools, change in bowel habits and nausea/vomiting Musculoskeletal ROS: negative for - joint pain, muscle pain or muscular weakness Genito-Urinary ROS: negative for - change in menstrual cycle, dysmenorrhea, dyspareunia, dysuria, , genital ulcers, hematuria, incontinence, irregular/heavy menses, nocturia or pelvic pain. Positive for increased discharge.   Objective:   BP 103/74    Pulse 79    Ht 5\' 5"  (1.651 m)    Wt 215 lb 2 oz (97.6 kg)    LMP 09/22/2019 (Approximate)    Breastfeeding Yes    BMI 35.80 kg/m  CONSTITUTIONAL: Well-developed, well-nourished female in no acute distress.  HENT:  Normocephalic, atraumatic.  NECK: Normal range of motion, supple, no masses.  Normal thyroid.  SKIN: Skin is warm and dry. No rash noted. Not diaphoretic. No erythema. No pallor. [place]: Alert and oriented to person, place, and time. PSYCHIATRIC: Normal mood and affect. Normal behavior. Normal judgment and thought content. CARDIOVASCULAR:Not Examined RESPIRATORY: Not Examined BREASTS: Not Examined ABDOMEN: Soft, non distended; Non tender.  No Organomegaly. PELVIC:  External Genitalia: Normal  BUS: Normal  Vagina: Normal  Cervix: Normal  Uterus: Normal size, shape,consistency, mobile  Adnexa: Normal  RV: Normal   Bladder: Nontender MUSCULOSKELETAL: Normal range of motion. No  tenderness.  No cyanosis, clubbing, or edema.     Assessment:   1. Encounter for initial prescription of contraceptives, unspecified contraceptive - POCT urine pregnancy  2. Skin mole 3 Stomach pain 4. Vaginal discharge 5.STD screening   Plan:   Discussed bc, she denies any contraindication to use. Pt state she was taking care of pt recently with c -diff. She has noticed increased abdominal pain , and a change in her bowel movement. Softer more frequent. She would like to have referral for dermatology to look a dark mole on her left leg. Orders placed. Follow up prn.   Philip Aspen, CNM

## 2019-10-18 NOTE — Patient Instructions (Signed)
Vaginitis Vaginitis is a condition in which the vaginal tissue swells and becomes red (inflamed). This condition is most often caused by a change in the normal balance of bacteria and yeast that live in the vagina. This change causes an overgrowth of certain bacteria or yeast, which causes the inflammation. There are different types of vaginitis, but the most common types are:  Bacterial vaginosis.  Yeast infection (candidiasis).  Trichomoniasis vaginitis. This is a sexually transmitted disease (STD).  Viral vaginitis.  Atrophic vaginitis.  Allergic vaginitis. What are the causes? The cause of this condition depends on the type of vaginitis. It can be caused by:  Bacteria (bacterial vaginosis).  Yeast, which is a fungus (yeast infection).  A parasite (trichomoniasis vaginitis).  A virus (viral vaginitis).  Low hormone levels (atrophic vaginitis). Low hormone levels can occur during pregnancy, breastfeeding, or after menopause.  Irritants, such as bubble baths, scented tampons, and feminine sprays (allergic vaginitis). Other factors can change the normal balance of the yeast and bacteria that live in the vagina. These include:  Antibiotic medicines.  Poor hygiene.  Diaphragms, vaginal sponges, spermicides, birth control pills, and intrauterine devices (IUD).  Sex.  Infection.  Uncontrolled diabetes.  A weakened defense (immune) system. What increases the risk? This condition is more likely to develop in women who:  Smoke.  Use vaginal douches, scented tampons, or scented sanitary pads.  Wear tight-fitting pants.  Wear thong underwear.  Use oral birth control pills or an IUD.  Have sex without a condom.  Have multiple sex partners.  Have an STD.  Frequently use the spermicide nonoxynol-9.  Eat lots of foods high in sugar.  Have uncontrolled diabetes.  Have low estrogen levels.  Have a weakened immune system from an immune disorder or medical  treatment.  Are pregnant or breastfeeding. What are the signs or symptoms? Symptoms vary depending on the cause of the vaginitis. Common symptoms include:  Abnormal vaginal discharge. ? The discharge is white, gray, or yellow with bacterial vaginosis. ? The discharge is thick, white, and cheesy with a yeast infection. ? The discharge is frothy and yellow or greenish with trichomoniasis.  A bad vaginal smell. The smell is fishy with bacterial vaginosis.  Vaginal itching, pain, or swelling.  Sex that is painful.  Pain or burning when urinating. Sometimes there are no symptoms. How is this diagnosed? This condition is diagnosed based on your symptoms and medical history. A physical exam, including a pelvic exam, will also be done. You may also have other tests, including:  Tests to determine the pH level (acidity or alkalinity) of your vagina.  A whiff test, to assess the odor that results when a sample of your vaginal discharge is mixed with a potassium hydroxide solution.  Tests of vaginal fluid. A sample will be examined under a microscope. How is this treated? Treatment varies depending on the type of vaginitis you have. Your treatment may include:  Antibiotic creams or pills to treat bacterial vaginosis and trichomoniasis.  Antifungal medicines, such as vaginal creams or suppositories, to treat a yeast infection.  Medicine to ease discomfort if you have viral vaginitis. Your sexual partner should also be treated.  Estrogen delivered in a cream, pill, suppository, or vaginal ring to treat atrophic vaginitis. If vaginal dryness occurs, lubricants and moisturizing creams may help. You may need to avoid scented soaps, sprays, or douches.  Stopping use of a product that is causing allergic vaginitis. Then using a vaginal cream to treat the symptoms. Follow   these instructions at home: Lifestyle  Keep your genital area clean and dry. Avoid soap, and only rinse the area with  water.  Do not douche or use tampons until your health care provider says it is okay to do so. Use sanitary pads, if needed.  Do not have sex until your health care provider approves. When you can return to sex, practice safe sex and use condoms.  Wipe from front to back. This avoids the spread of bacteria from the rectum to the vagina. General instructions  Take over-the-counter and prescription medicines only as told by your health care provider.  If you were prescribed an antibiotic medicine, take or use it as told by your health care provider. Do not stop taking or using the antibiotic even if you start to feel better.  Keep all follow-up visits as told by your health care provider. This is important. How is this prevented?  Use mild, non-scented products. Do not use things that can irritate the vagina, such as fabric softeners. Avoid the following products if they are scented: ? Feminine sprays. ? Detergents. ? Tampons. ? Feminine hygiene products. ? Soaps or bubble baths.  Let air reach your genital area. ? Wear cotton underwear to reduce moisture buildup. ? Avoid wearing underwear while you sleep. ? Avoid wearing tight pants and underwear or nylons without a cotton panel. ? Avoid wearing thong underwear.  Take off any wet clothing, such as bathing suits, as soon as possible.  Practice safe sex and use condoms. Contact a health care provider if:  You have abdominal pain.  You have a fever.  You have symptoms that last for more than 2-3 days. Get help right away if:  You have a fever and your symptoms suddenly get worse. Summary  Vaginitis is a condition in which the vaginal tissue becomes inflamed.This condition is most often caused by a change in the normal balance of bacteria and yeast that live in the vagina.  Treatment varies depending on the type of vaginitis you have.  Do not douche, use tampons , or have sex until your health care provider approves. When  you can return to sex, practice safe sex and use condoms. This information is not intended to replace advice given to you by your health care provider. Make sure you discuss any questions you have with your health care provider. Document Revised: 12/06/2016 Document Reviewed: 01/30/2016 Elsevier Patient Education  2020 Elsevier Inc.  

## 2019-10-19 ENCOUNTER — Other Ambulatory Visit: Payer: Self-pay | Admitting: Certified Nurse Midwife

## 2019-10-19 LAB — CERVICOVAGINAL ANCILLARY ONLY
Bacterial Vaginitis (gardnerella): NEGATIVE
Candida Glabrata: NEGATIVE
Candida Vaginitis: POSITIVE — AB
Chlamydia: NEGATIVE
Comment: NEGATIVE
Comment: NEGATIVE
Comment: NEGATIVE
Comment: NEGATIVE
Comment: NEGATIVE
Comment: NORMAL
Neisseria Gonorrhea: NEGATIVE
Trichomonas: NEGATIVE

## 2019-10-19 LAB — RPR: RPR Ser Ql: NONREACTIVE

## 2019-10-19 LAB — HEPATITIS C ANTIBODY: Hep C Virus Ab: <0.1 s/co ratio (ref 0.0–0.9)

## 2019-10-19 LAB — HEPATITIS B SURFACE ANTIGEN: Hepatitis B Surface Ag: NEGATIVE

## 2019-10-19 LAB — HIV ANTIBODY (ROUTINE TESTING W REFLEX): HIV Screen 4th Generation wRfx: NONREACTIVE

## 2019-10-19 MED ORDER — FLUCONAZOLE 150 MG PO TABS: 150.0000 mg | ORAL_TABLET | Freq: Once | ORAL | 0 refills | Status: AC

## 2019-10-27 ENCOUNTER — Encounter: Payer: Self-pay | Admitting: *Deleted

## 2020-01-06 IMAGING — US US OB LIMITED
2 series · 14 of 28 positions shown · non-contrast
Comparison: 09/10/2017

CLINICAL DATA: Vaginal bleeding for 1 day

EXAM:
LIMITED OBSTETRIC ULTRASOUND

[Series 1: us ob limited · 0.23mm/px · 34 acquisitions, 5 frames shown (1 of 2)]
[im 4/34]
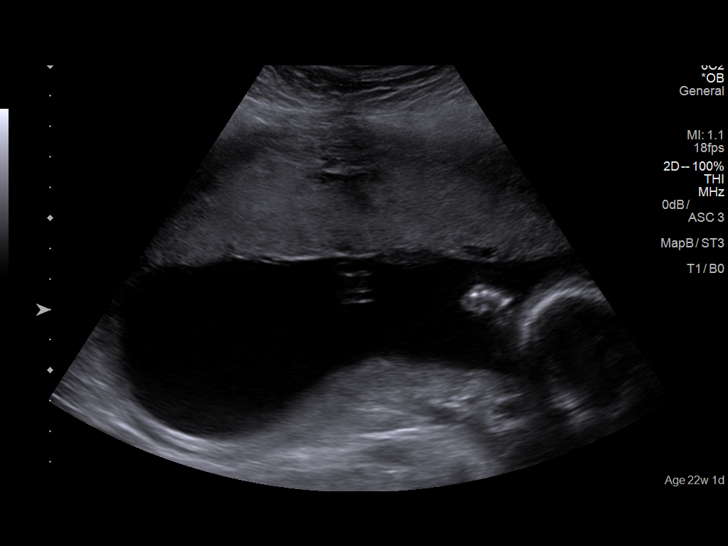
[im 12/34]
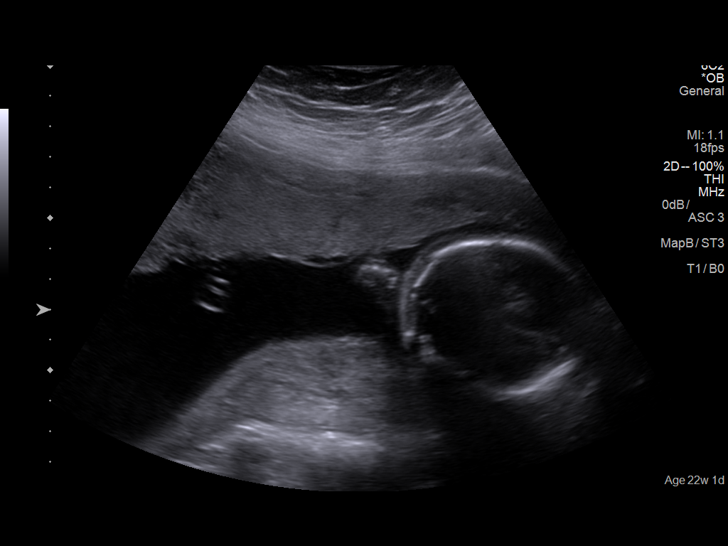
[im 19/34]
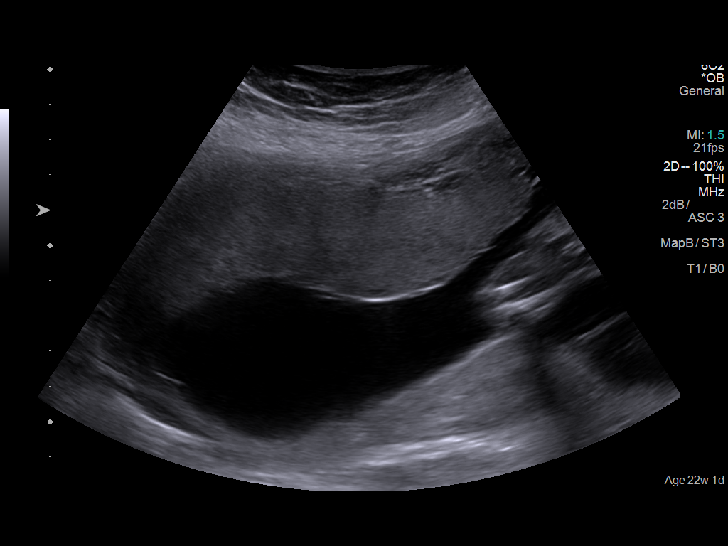
[im 26/34]
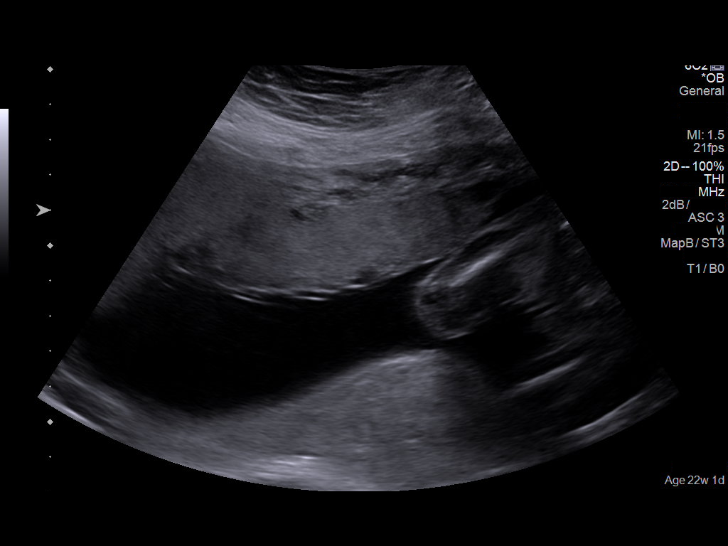
[im 34/34]
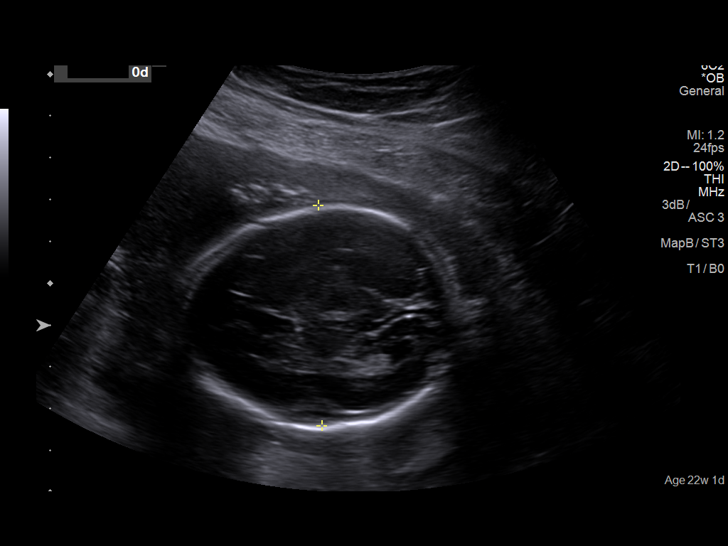

[Series 1001: us ob limited · 0.22mm/px · 64 acquisitions, 9 frames shown (2 of 2)]
[im 4/64]
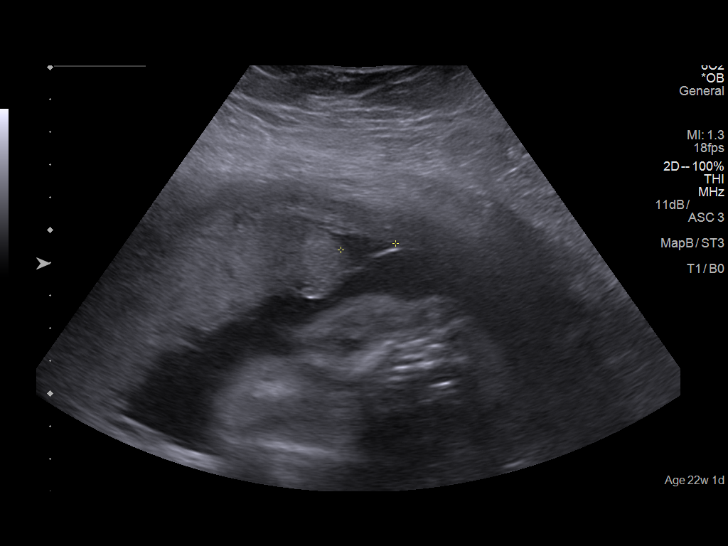
[im 12/64]
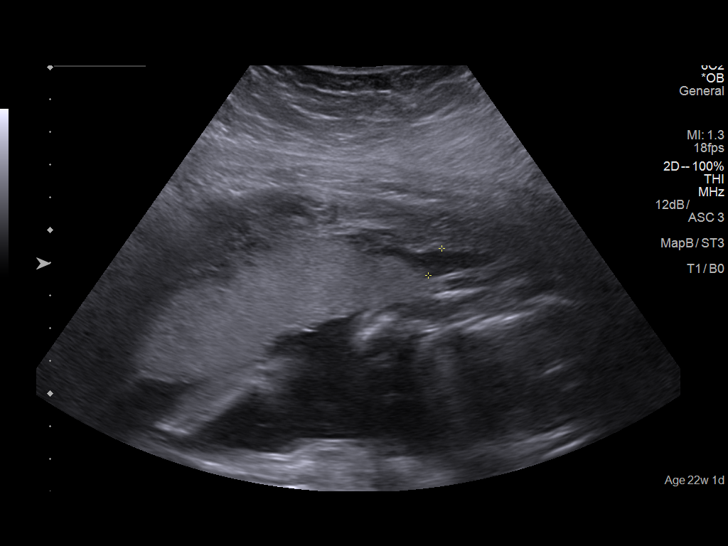
[im 19/64]
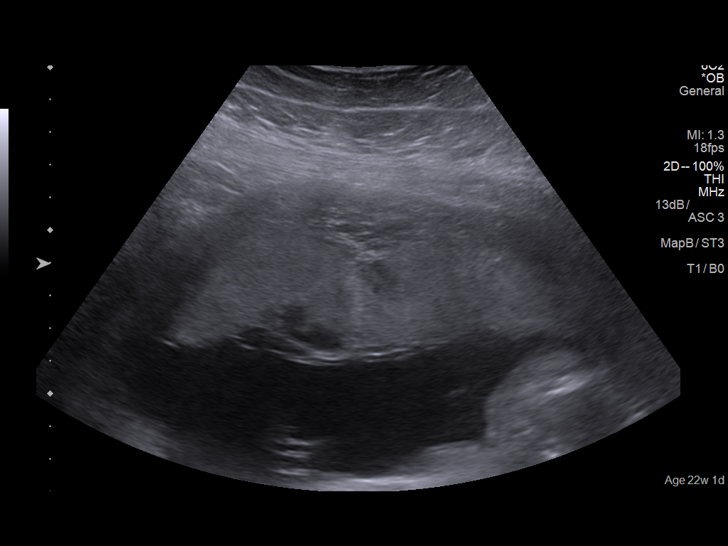
[im 26/64]
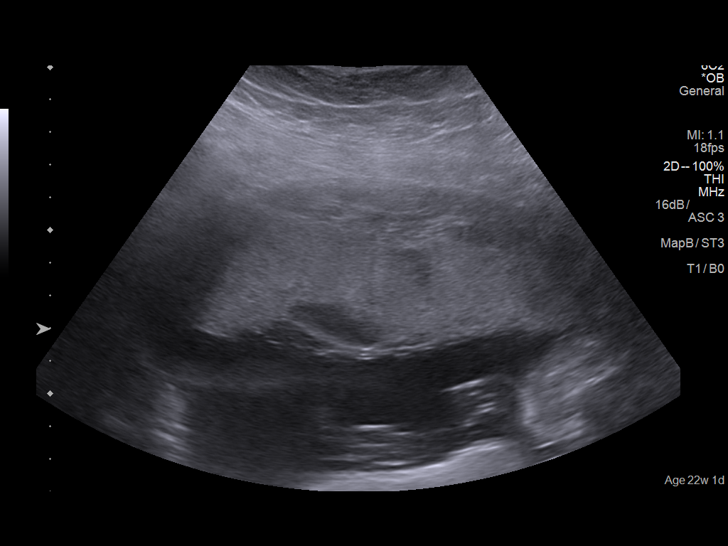
[im 34/64]
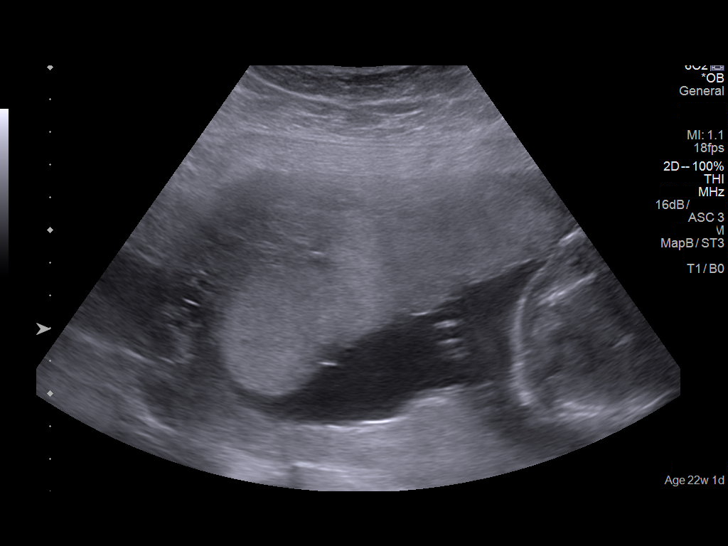
[im 41/64]
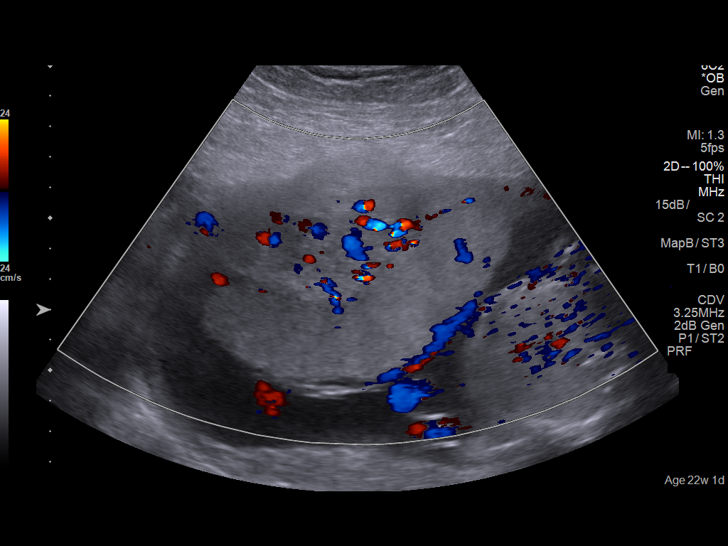
[im 49/64]
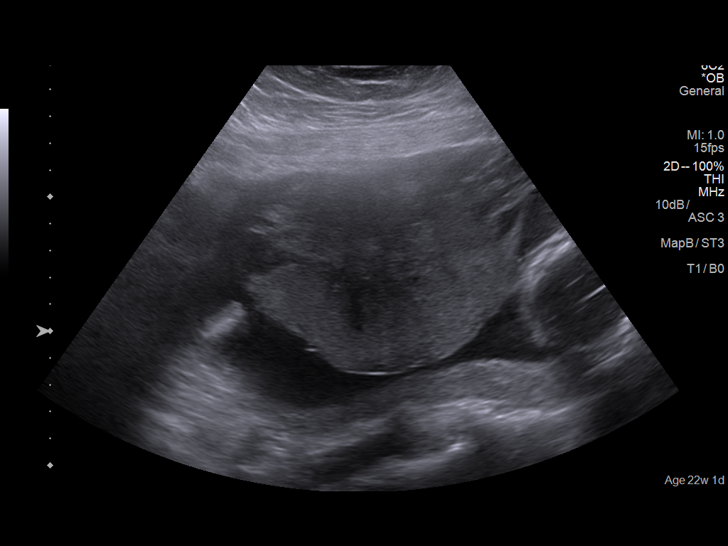
[im 56/64]
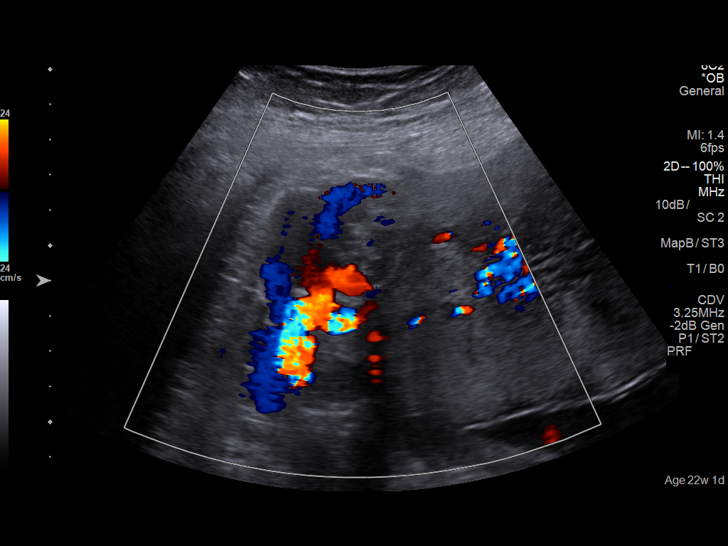
[im 64/64]
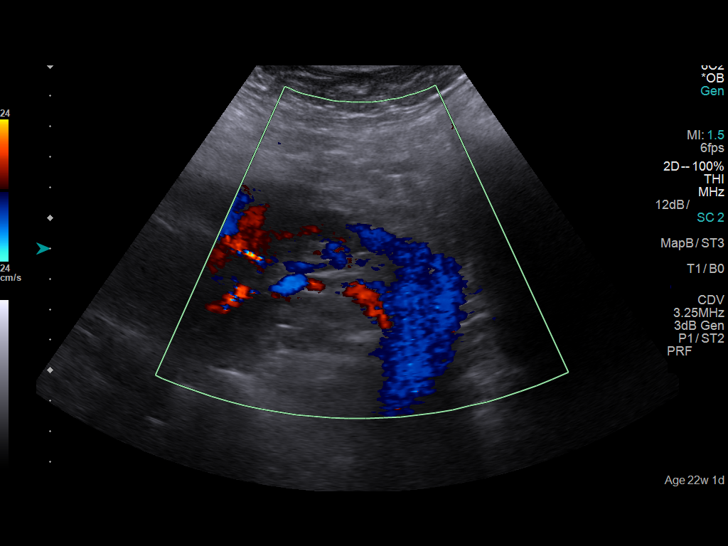

[14 of 28 positions shown; findings below may reference images not displayed]

FINDINGS: Number of Fetuses: 1

Heart Rate:  130 bpm

Movement: Present

Presentation: Cephalic

Previa: No

Placental Location: Anterior, there is a hypoechoic area along the
placental margin laterally on the left. A small abruption would be
difficult to exclude. No other focal placental abnormality is noted.

Amniotic Fluid (Subjective): Normal

BPD:  5.29cm 22w 0d

Maternal Findings:

Cervix:  Closed

Uterus/Adnexae: No abnormality visualized.
IMPRESSION: Single live intrauterine gestation at approximately 22 weeks. This
is an adequate progression from the prior exam.

Small hypoechoic area along the left placental margin laterally of
uncertain significance. A small abruption could not be totally
excluded given the patient's clinical history. Short-term follow-up
examination is recommended

No other focal abnormality is noted.

## 2020-02-22 ENCOUNTER — Other Ambulatory Visit: Payer: Self-pay | Admitting: Certified Nurse Midwife

## 2020-02-22 MED ORDER — VALACYCLOVIR HCL 1 G PO TABS: 1000.0000 mg | ORAL_TABLET | Freq: Every day | ORAL | 3 refills | Status: AC

## 2020-02-22 NOTE — Progress Notes (Signed)
Orders placed for valtrex for outbreak per pt request.   Krystal Harrison, CNM

## 2020-03-01 ENCOUNTER — Ambulatory Visit: Payer: Medicaid Other | Admitting: Dermatology

## 2020-04-05 ENCOUNTER — Encounter: Payer: Self-pay | Admitting: Certified Nurse Midwife

## 2020-04-05 ENCOUNTER — Ambulatory Visit (INDEPENDENT_AMBULATORY_CARE_PROVIDER_SITE_OTHER): Payer: Medicaid Other | Admitting: Certified Nurse Midwife

## 2020-04-05 ENCOUNTER — Other Ambulatory Visit: Payer: Self-pay

## 2020-04-05 VITALS — BP 114/75 | HR 84 | Resp 16 | Ht 66.0 in | Wt 218.4 lb

## 2020-04-05 DIAGNOSIS — Z862 Personal history of diseases of the blood and blood-forming organs and certain disorders involving the immune mechanism: Secondary | ICD-10-CM | POA: Diagnosis not present

## 2020-04-05 DIAGNOSIS — Z01419 Encounter for gynecological examination (general) (routine) without abnormal findings: Secondary | ICD-10-CM | POA: Diagnosis not present

## 2020-04-05 DIAGNOSIS — Z833 Family history of diabetes mellitus: Secondary | ICD-10-CM

## 2020-04-05 DIAGNOSIS — R5383 Other fatigue: Secondary | ICD-10-CM | POA: Diagnosis not present

## 2020-04-05 NOTE — Progress Notes (Signed)
GYNECOLOGY ANNUAL PREVENTATIVE CARE ENCOUNTER NOTE  History:     Krystal Harrison is a 35 y.o. [phone] female here for a routine annual gynecologic exam.  Current complaints: none.   Denies abnormal vaginal bleeding, discharge, pelvic pain, problems with intercourse or other gynecologic concerns.     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 Contraception: none, not sexually active  Last Pap: 06/03/18 Results were: ASCUS with negative HPV Last mammogram: n/a  Obstetric History OB History  Gravida Para Term Preterm AB Living  6 5 5  0 1 5  SAB IAB Ectopic Multiple Live Births  1 0 0 0 5    # Outcome Date GA Lbr Len/2nd Weight Sex Delivery Anes PTL Lv  6 Term 01/29/18 [redacted]w[redacted]d 13:48 / 00:27 8 lb 11.7 oz (3.96 kg) F Vag-Spont None  LIV  5 Term 08/29/15 [redacted]w[redacted]d 06:41 / 00:02 8 lb 1.8 oz (3.68 kg) M Vag-Spont None  LIV  4 SAB 2016        FD  3 Term 09/12/11 [redacted]w[redacted]d  8 lb 12 oz (3.969 kg) F Vag-Spont  Y LIV  2 Term 12/31/07 [redacted]w[redacted]d  7 lb 14 oz (3.572 kg) M Vag-Spont  N LIV  1 Term 09/07/04 [redacted]w[redacted]d  7 lb 15 oz (3.6 kg) F Vag-Spont   LIV    Obstetric Comments  09/12/2011 pt leaking fluid at 32wks, hospitalized until delivery at 38wks.    Past Medical History:  Diagnosis Date  . Complication of anesthesia    pt. reports no knowledge of complications  . Herpes genitalia    last outbreak 1-2 yrs ago    Past Surgical History:  Procedure Laterality Date  . EYE SURGERY    . EYE SURGERY Bilateral     Current Outpatient Medications on File Prior to Visit  Medication Sig Dispense Refill  . etonogestrel-ethinyl estradiol (NUVARING) 0.12-0.015 MG/24HR vaginal ring Insert vaginally and leave in place for 3 consecutive weeks, then remove for 1 week. 1 each 12   No current facility-administered medications on file prior to visit.    No Known  Allergies  Social History:  reports that she has never smoked. She has never used smokeless tobacco. She reports that she does not drink alcohol and does not use drugs.  Family History  Problem Relation Age of Onset  . Diabetes Mother   . Anesthesia problems Neg Hx     The following portions of the patient's history were reviewed and updated as appropriate: allergies, current medications, past family history, past medical history, past social history, past surgical history and problem list.  Review of Systems Pertinent items noted in HPI and remainder of comprehensive ROS otherwise negative.  Physical Exam:  BP 114/75   Pulse 84   Resp 16   Ht 5\' 6"  (1.676 m)   Wt 218 lb 6.4 oz (99.1 kg)   SpO2 99%   BMI 35.25 kg/m  CONSTITUTIONAL: Well-developed, well-nourished female in no acute distress.  HENT:  Normocephalic, atraumatic, External right and left ear normal. Oropharynx is clear and moist EYES: Conjunctivae and EOM are normal. Pupils are equal, round, and reactive to light. No scleral icterus.  NECK: Normal range of motion, supple, no masses.  Normal thyroid.  SKIN: Skin is warm and dry. No rash noted. Not diaphoretic. No erythema. No pallor. MUSCULOSKELETAL: Normal range of motion.  No tenderness.  No cyanosis, clubbing, or edema.  2+ distal pulses. NEUROLOGIC: Alert and oriented to person, place, and time. Normal reflexes, muscle tone coordination.  PSYCHIATRIC: Normal mood and affect. Normal behavior. Normal judgment and thought content. CARDIOVASCULAR: Normal heart rate noted, regular rhythm RESPIRATORY: Clear to auscultation bilaterally. Effort and breath sounds normal, no problems with respiration noted. BREASTS: Symmetric in size. No masses, tenderness, skin changes, nipple drainage, or lymphadenopathy bilaterally.  ABDOMEN: Soft, no distention noted.  No tenderness, rebound or guarding.  PELVIC: Normal appearing external genitalia and urethral meatus; normal appearing  vaginal mucosa and cervix.  No abnormal discharge noted.  Pap smear not due  Normal uterine size, no other palpable masses, no uterine or adnexal tenderness.  .   Assessment and Plan:    1. Women's annual routine gynecological examination   Pap: not due Mammogram : n/a  Labs: CBC-fatigue/hx anemia , Hemoglobin A1c, pt request due to family history  Refills: none Referral: none Routine preventative health maintenance measures emphasized. Please refer to After Visit Summary for other counseling recommendations.      Philip Aspen, CNM [facility]

## 2020-04-05 NOTE — Patient Instructions (Signed)
Preventive Care 21-34 Years Old, Female Preventive care refers to lifestyle choices and visits with your health care provider that can promote health and wellness. This includes:  A yearly physical exam. This is also called an annual wellness visit.  Regular dental and eye exams.  Immunizations.  Screening for certain conditions.  Healthy lifestyle choices, such as: ? Eating a healthy diet. ? Getting regular exercise. ? Not using drugs or products that contain nicotine and tobacco. ? Limiting alcohol use. What can I expect for my preventive care visit? Physical exam Your health care provider may check your:  Height and weight. These may be used to calculate your BMI (body mass index). BMI is a measurement that tells if you are at a healthy weight.  Heart rate and blood pressure.  Body temperature.  Skin for abnormal spots. Counseling Your health care provider may ask you questions about your:  Past medical problems.  Family's medical history.  Alcohol, tobacco, and drug use.  Emotional well-being.  Home life and relationship well-being.  Sexual activity.  Diet, exercise, and sleep habits.  Work and work environment.  Access to firearms.  Method of birth control.  Menstrual cycle.  Pregnancy history. What immunizations do I need? Vaccines are usually given at various ages, according to a schedule. Your health care provider will recommend vaccines for you based on your age, medical history, and lifestyle or other factors, such as travel or where you work.   What tests do I need? Blood tests  Lipid and cholesterol levels. These may be checked every 5 years starting at age 20.  Hepatitis C test.  Hepatitis B test. Screening  Diabetes screening. This is done by checking your blood sugar (glucose) after you have not eaten for a while (fasting).  STD (sexually transmitted disease) testing, if you are at risk.  BRCA-related cancer screening. This may be  done if you have a family history of breast, ovarian, tubal, or peritoneal cancers.  Pelvic exam and Pap test. This may be done every 3 years starting at age 21. Starting at age 30, this may be done every 5 years if you have a Pap test in combination with an HPV test. Talk with your health care provider about your test results, treatment options, and if necessary, the need for more tests.   Follow these instructions at home: Eating and drinking  Eat a healthy diet that includes fresh fruits and vegetables, whole grains, lean protein, and low-fat dairy products.  Take vitamin and mineral supplements as recommended by your health care provider.  Do not drink alcohol if: ? Your health care provider tells you not to drink. ? You are pregnant, may be pregnant, or are planning to become pregnant.  If you drink alcohol: ? Limit how much you have to 0-1 drink a day. ? Be aware of how much alcohol is in your drink. In the [place] one drink equals one 12 oz bottle of beer (355 mL), one 5 oz glass of wine (148 mL), or one 1 oz glass of hard liquor (44 mL).   Lifestyle  Take daily care of your teeth and gums. Brush your teeth every morning and night with fluoride toothpaste. Floss one time each day.  Stay active. Exercise for at least 30 minutes 5 or more days each week.  Do not use any products that contain nicotine or tobacco, such as cigarettes, e-cigarettes, and chewing tobacco. If you need help quitting, ask your health care provider.  Do not   use drugs.  If you are sexually active, practice safe sex. Use a condom or other form of protection to prevent STIs (sexually transmitted infections).  If you do not wish to become pregnant, use a form of birth control. If you plan to become pregnant, see your health care provider for a prepregnancy visit.  Find healthy ways to cope with stress, such as: ? Meditation, yoga, or listening to music. ? Journaling. ? Talking to a trusted  person. ? Spending time with friends and family. Safety  Always wear your seat belt while driving or riding in a vehicle.  Do not drive: ? If you have been drinking alcohol. Do not ride with someone who has been drinking. ? When you are tired or distracted. ? While texting.  Wear a helmet and other protective equipment during sports activities.  If you have firearms in your house, make sure you follow all gun safety procedures.  Seek help if you have been physically or sexually abused. What's next?  Go to your health care provider once a year for an annual wellness visit.  Ask your health care provider how often you should have your eyes and teeth checked.  Stay up to date on all vaccines. This information is not intended to replace advice given to you by your health care provider. Make sure you discuss any questions you have with your health care provider. Document Revised: 08/22/2019 Document Reviewed: 09/04/2017 Elsevier Patient Education  2021 Elsevier Inc.  

## 2020-04-06 LAB — CBC
Hematocrit: 38.2 % (ref 34.0–46.6)
Hemoglobin: 12.3 g/dL (ref 11.1–15.9)
MCH: 26.6 pg (ref 26.6–33.0)
MCHC: 32.2 g/dL (ref 31.5–35.7)
MCV: 83 fL (ref 79–97)
Platelets: 315 10*3/uL (ref 150–450)
RBC: 4.63 x10E6/uL (ref 3.77–5.28)
RDW: 14.1 % (ref 11.7–15.4)
WBC: 8.7 10*3/uL (ref 3.4–10.8)

## 2020-04-06 LAB — HEMOGLOBIN A1C
Est. average glucose Bld gHb Est-mCnc: 108 mg/dL
Hgb A1c MFr Bld: 5.4 % (ref 4.8–5.6)

## 2020-06-12 ENCOUNTER — Other Ambulatory Visit: Payer: Medicaid Other

## 2020-06-13 ENCOUNTER — Other Ambulatory Visit: Payer: Medicaid Other

## 2020-06-13 ENCOUNTER — Ambulatory Visit (INDEPENDENT_AMBULATORY_CARE_PROVIDER_SITE_OTHER): Payer: Medicaid Other | Admitting: Certified Nurse Midwife

## 2020-06-13 ENCOUNTER — Encounter: Payer: Self-pay | Admitting: Certified Nurse Midwife

## 2020-06-13 ENCOUNTER — Other Ambulatory Visit: Payer: Self-pay

## 2020-06-13 VITALS — BP 115/80 | HR 90 | Ht 66.0 in | Wt 212.2 lb

## 2020-06-13 DIAGNOSIS — R42 Dizziness and giddiness: Secondary | ICD-10-CM

## 2020-06-13 DIAGNOSIS — R5383 Other fatigue: Secondary | ICD-10-CM

## 2020-06-13 NOTE — Progress Notes (Signed)
GYN ENCOUNTER NOTE  Subjective:       Krystal Harrison is a 35 y.o. [phone] female is here for gynecologic evaluation of the following issues:  1. Pt state she has felt fatigued and had some dizzy spells over the past 2 wks. She notes that she has been trying to eat better and has skipped some meals due to being busy. She denies fainting , denies history of vertigo, nausea other symptoms.      Gynecologic History Patient's last menstrual period was 05/26/2020 (approximate). Contraception: none Last Pap: 06/03/18. Results were: ASCUS/HPV neg Last mammogram: n/a   Obstetric History OB History  Gravida Para Term Preterm AB Living  6 5 5  0 1 5  SAB IAB Ectopic Multiple Live Births  1 0 0 0 5    # Outcome Date GA Lbr Len/2nd Weight Sex Delivery Anes PTL Lv  6 Term 01/29/18 [redacted]w[redacted]d 13:48 / 00:27 8 lb 11.7 oz (3.96 kg) F Vag-Spont None  LIV  5 Term 08/29/15 [redacted]w[redacted]d 06:41 / 00:02 8 lb 1.8 oz (3.68 kg) M Vag-Spont None  LIV  4 SAB 2016        FD  3 Term 09/12/11 [redacted]w[redacted]d  8 lb 12 oz (3.969 kg) F Vag-Spont  Y LIV  2 Term 12/31/07 [redacted]w[redacted]d  7 lb 14 oz (3.572 kg) M Vag-Spont  N LIV  1 Term 09/07/04 [redacted]w[redacted]d  7 lb 15 oz (3.6 kg) F Vag-Spont   LIV    Obstetric Comments  09/12/2011 pt leaking fluid at 32wks, hospitalized until delivery at 38wks.    Past Medical History:  Diagnosis Date  . Complication of anesthesia    pt. reports no knowledge of complications  . Herpes genitalia    last outbreak 1-2 yrs ago    Past Surgical History:  Procedure Laterality Date  . EYE SURGERY    . EYE SURGERY Bilateral     Current Outpatient Medications on File Prior to Visit  Medication Sig Dispense Refill  . etonogestrel-ethinyl estradiol (NUVARING) 0.12-0.015 MG/24HR vaginal ring Insert vaginally and leave in place for 3 consecutive weeks, then remove for 1 week. 1 each 12   No current facility-administered medications on file prior to visit.    No Known Allergies  Social History   Socioeconomic History  .  Marital status: Single    Spouse name: Not on file  . Number of children: Not on file  . Years of education: Not on file  . Highest education level: Not on file  Occupational History  . Not on file  Tobacco Use  . Smoking status: Never Smoker  . Smokeless tobacco: Never Used  Vaping Use  . Vaping Use: Never used  Substance and Sexual Activity  . Alcohol use: No  . Drug use: No  . Sexual activity: Yes    Birth control/protection: None, Inserts    Comment: Possibly BTL   Other Topics Concern  . Not on file  Social History Narrative  . Not on file   Social Determinants of Health   Financial Resource Strain: Not on file  Food Insecurity: Not on file  Transportation Needs: Not on file  Physical Activity: Not on file  Stress: Not on file  Social Connections: Not on file  Intimate Partner Violence: Not on file    Family History  Problem Relation Age of Onset  . Diabetes Mother   . Congestive Heart Failure Maternal Grandmother   . Anesthesia problems Neg Hx     The following  portions of the patient's history were reviewed and updated as appropriate: allergies, current medications, past family history, past medical history, past social history, past surgical history and problem list.  Review of Systems Review of Systems - Negative except except as mentioned in HPI Review of Systems - General ROS: negative for - chills, fatigue, fever, hot flashes, malaise or night sweats Hematological and Lymphatic ROS: negative for - bleeding problems or swollen lymph nodes Gastrointestinal ROS: negative for - abdominal pain, blood in stools, change in bowel habits and nausea/vomiting Musculoskeletal ROS: negative for - joint pain, muscle pain or muscular weakness Genito-Urinary ROS: negative for - change in menstrual cycle, dysmenorrhea, dyspareunia, dysuria, genital discharge, genital ulcers, hematuria, incontinence, irregular/heavy menses, nocturia or pelvic pain  Objective:   BP  115/80   Pulse 90   Ht 5\' 6"  (1.676 m)   Wt 212 lb 3.2 oz (96.3 kg)   LMP 05/26/2020 (Approximate)   BMI 34.25 kg/m  CONSTITUTIONAL: Well-developed, well-nourished female in no acute distress.  HENT:  Normocephalic, atraumatic.  NECK: Normal range of motion, supple, no masses.  Normal thyroid.  SKIN: Skin is warm and dry. No rash noted. Not diaphoretic. No erythema. No pallor. [place]: Alert and oriented to person, place, and time. PSYCHIATRIC: Normal mood and affect. Normal behavior. Normal judgment and thought content. CARDIOVASCULAR:Not Examined RESPIRATORY: Not Examined BREASTS: Not Examined ABDOMEN: Soft, non distended; Non tender.  No Organomegaly. PELVIC:not indicated MUSCULOSKELETAL: Normal range of motion. No tenderness.  No cyanosis, clubbing, or edema.     Assessment:   1. Other fatigue - CBC - Iron - TSH  2. Dizzy spells - CBC - Iron     Plan:   Pt admits that she feels better once she eats. Discussed possible cause including anemia, and tyroid disorder. Ordered CBC/iron/TSH today. Will follow up with results. Discussed likely due to low blood sugar. Reviewed importance of eating regularly to maintain a normal blood sugar. Suggest getting health snacks that she can grab on the go in case she has episode of feeling dizzy. She verbalizes and agrees to plan.  Follow prn.   Philip Aspen, CNM

## 2020-06-13 NOTE — Patient Instructions (Signed)
Hypoglycemia Hypoglycemia is when the sugar (glucose) level in your blood is too low. Low blood sugar can happen to people who have diabetes and people who do not have diabetes. Low blood sugar can happen quickly, and it can be an emergency. What are the causes? This condition happens most often in people who have diabetes and may be caused by:  Diabetes medicine.  Not eating enough, or not eating often enough.  Doing more physical activity.  Drinking alcohol on an empty stomach. If you do not have diabetes, hypoglycemia may be caused by:  A tumor in the pancreas.  Not eating enough, or not eating for long periods at a time (fasting).  A very bad infection or illness.  Problems after having weight loss (bariatric) surgery.  Kidney failure or liver failure.  Certain medicines. What increases the risk? This condition is more likely to develop in people who:  Have diabetes and take medicines to lower their blood sugar.  Abuse alcohol.  Have a very bad illness. What are the signs or symptoms? Symptoms depend on whether your low blood sugar is mild, moderate, or very low. Mild  Hunger.  Feeling worried or nervous (anxious).  Sweating and feeling clammy.  Feeling dizzy or light-headed.  Being sleepy or having trouble sleeping.  Feeling like you may vomit (nauseous).  A fast heartbeat.  A headache.  Blurry vision.  Being irritable or grouchy.  Tingling or loss of feeling (numbness) around your mouth, lips, or tongue.  Trouble with moving (coordination). Moderate  Confusion and poor judgment.  Behavior changes.  Weakness.  Uneven heartbeats. Very low Very low blood sugar (severe hypoglycemia) is a medical emergency. It can cause:  Fainting.  Jerky movements that you cannot control (seizure).  Loss of consciousness (coma).  Death. How is this treated? Treating low blood sugar Low blood sugar is often treated by eating or drinking something  sugary right away. The snack should contain 15 grams of a fast-acting carb (carbohydrate). Options include:  4 oz (120 mL) of fruit juice.  4-6 oz (120-150 mL) of regular soda (not diet soda).  8 oz (240 mL) of low-fat milk.  Several pieces of hard candy. Check food labels to find out how many to eat for 15 grams.  1 Tbsp (15 mL) of sugar or honey. Treating low blood sugar if you have diabetes If you can think clearly and swallow safely, follow the 15:15 rule:  Take 15 grams of a fast-acting carb. Talk with your doctor about how much you should take.  Always keep a source of fast-acting carb with you, such as: ? Sugar tablets (glucose pills). Take 4 pills. ? Several pieces of hard candy. Check food labels to see how many pieces to eat for 15 grams. ? 4 oz (120 mL) of fruit juice. ? 4-6 oz (120-150 mL) of regular (not diet) soda. ? 1 Tbsp (15 mL) of honey or sugar.  Check your blood sugar 15 minutes after you take the carb.  If your blood sugar is still at or below 70 mg/dL (3.9 mmol/L), take 15 grams of a carb again.  If your blood sugar does not go above 70 mg/dL (3.9 mmol/L) after 3 tries, get help right away.  After your blood sugar goes back to normal, eat a meal or a snack within 1 hour.   Treating very low blood sugar If your blood sugar is at or below 54 mg/dL (3 mmol/L), you have very low blood sugar, or severe   hypoglycemia. This is an emergency. Get medical help right away. If you have very low blood sugar and you cannot eat or drink, you will need to be given a hormone called glucagon. A family member or friend should learn how to check your blood sugar and how to give you glucagon. Ask your doctor if you need to have an emergency glucagon kit at home. Very low blood sugar may also need to be treated in a hospital. Follow these instructions at home: General instructions  Take over-the-counter and prescription medicines only as told by your doctor.  Stay aware of your  blood sugar as told by your doctor.  If you drink alcohol: ? Limit how much you use to:  0-1 drink a day for nonpregnant women.  0-2 drinks a day for men. ? Be aware of how much alcohol is in your drink. In the [place] one drink equals one 12 oz bottle of beer (355 mL), one 5 oz glass of wine (148 mL), or one 1 oz glass of hard liquor (44 mL).  Keep all follow-up visits as told by your doctor. This is important. If you have diabetes:  Always have a rapid-acting carb (15 grams) option with you to treat low blood sugar.  Follow your diabetes care plan as told by your doctor. Make sure you: ? Know the symptoms of low blood sugar. ? Check your blood sugar as often as told by your doctor. Always check it before and after exercise. ? Always check your blood sugar before you drive. ? Take your medicines as told. ? Follow your meal plan. ? Eat on time. Do not skip meals.  Share your diabetes care plan with: ? Your work or school. ? People you live with.  Carry a card or wear jewelry that says you have diabetes.   Contact a doctor if:  You have trouble keeping your blood sugar in your target range.  You have low blood sugar often. Get help right away if:  You still have symptoms after you eat or drink something that contains 15 grams of fast-acting carb and you cannot get your blood sugar above 70 mg/dL by following the 15:15 rule.  Your blood sugar is at or below 54 mg/dL (3 mmol/L).  You have a seizure.  You faint. These symptoms may be an emergency. Do not wait to see if the symptoms will go away. Get medical help right away. Call your local emergency services (911 in the [place] Do not drive yourself to the hospital. Summary  Hypoglycemia happens when the level of sugar (glucose) in your blood is too low.  Low blood sugar can happen to people who have diabetes and people who do not have diabetes. Low blood sugar can happen quickly, and it can be an emergency.  Make sure you  know the symptoms of low blood sugar and know how to treat it.  Always keep a source of sugar (fast-acting carb) with you to treat low blood sugar. This information is not intended to replace advice given to you by your health care provider. Make sure you discuss any questions you have with your health care provider. Document Revised: 11/18/2018 Document Reviewed: 11/18/2018 Elsevier Patient Education  2021 [employer].

## 2020-06-13 NOTE — Progress Notes (Signed)
Pt present due to having dizziness. Pt c/o dizziness x 2 weeks. Pt stated that she noticed the dizziness when she change positions.

## 2020-06-14 LAB — CBC
Hematocrit: 39.7 % (ref 34.0–46.6)
Hemoglobin: 13.1 g/dL (ref 11.1–15.9)
MCH: 27.8 pg (ref 26.6–33.0)
MCHC: 33 g/dL (ref 31.5–35.7)
MCV: 84 fL (ref 79–97)
Platelets: 238 10*3/uL (ref 150–450)
RBC: 4.71 x10E6/uL (ref 3.77–5.28)
RDW: 15.8 % — ABNORMAL HIGH (ref 11.7–15.4)
WBC: 9.4 10*3/uL (ref 3.4–10.8)

## 2020-06-14 LAB — IRON: Iron: 74 ug/dL (ref 27–159)

## 2020-06-14 LAB — TSH: TSH: 1.42 u[IU]/mL (ref 0.450–4.500)

## 2020-07-08 ENCOUNTER — Other Ambulatory Visit: Payer: Self-pay

## 2020-07-08 ENCOUNTER — Emergency Department
Admission: EM | Admit: 2020-07-08 | Discharge: 2020-07-08 | Disposition: A | Discharge status: Home / Self Care | Payer: Medicaid Other | Attending: Emergency Medicine | Admitting: Emergency Medicine

## 2020-07-08 ENCOUNTER — Emergency Department: Payer: Medicaid Other

## 2020-07-08 DIAGNOSIS — U071 COVID-19: Secondary | ICD-10-CM | POA: Diagnosis not present

## 2020-07-08 DIAGNOSIS — R0602 Shortness of breath: Secondary | ICD-10-CM | POA: Diagnosis present

## 2020-07-08 LAB — CBC
HCT: 29.4 % — ABNORMAL LOW (ref 36.0–46.0)
Hemoglobin: 9.1 g/dL — ABNORMAL LOW (ref 12.0–15.0)
MCH: 26.9 pg (ref 26.0–34.0)
MCHC: 31 g/dL (ref 30.0–36.0)
MCV: 87 fL (ref 80.0–100.0)
Platelets: 518 10*3/uL — ABNORMAL HIGH (ref 150–400)
RBC: 3.38 MIL/uL — ABNORMAL LOW (ref 3.87–5.11)
RDW: 16.8 % — ABNORMAL HIGH (ref 11.5–15.5)
WBC: 9.8 10*3/uL (ref 4.0–10.5)
nRBC: 0 % (ref 0.0–0.2)

## 2020-07-08 LAB — BASIC METABOLIC PANEL
Anion gap: 9 (ref 5–15)
BUN: 8 mg/dL (ref 6–20)
CO2: 26 mmol/L (ref 22–32)
Calcium: 8.8 mg/dL — ABNORMAL LOW (ref 8.9–10.3)
Chloride: 101 mmol/L (ref 98–111)
Creatinine, Ser: 0.6 mg/dL (ref 0.44–1.00)
GFR, Estimated: >60 mL/min (ref >60)
Glucose, Bld: 105 mg/dL — ABNORMAL HIGH (ref 70–99)
Potassium: 3.9 mmol/L (ref 3.5–5.1)
Sodium: 136 mmol/L (ref 135–145)

## 2020-07-08 LAB — TROPONIN I (HIGH SENSITIVITY): Troponin I (High Sensitivity): <2 ng/L (ref <18)

## 2020-07-08 MED ORDER — DEXAMETHASONE 10 MG/ML FOR PEDIATRIC ORAL USE
10.0000 mg | Freq: Once | INTRAMUSCULAR | Status: AC
  Administered 2020-07-08: 10 mg via ORAL
  Filled 2020-07-08: qty 1

## 2020-07-08 MED ORDER — NIRMATRELVIR/RITONAVIR (PAXLOVID)TABLET: 3.0000 | ORAL_TABLET | Freq: Two times a day (BID) | ORAL | 0 refills | Status: AC

## 2020-07-08 NOTE — ED Provider Notes (Signed)
[facility] Emergency Department Provider Note   ____________________________________________   Event Date/Time   First MD Initiated Contact with Patient 07/08/20 [phone]     (approximate)  I have reviewed the triage vital signs and the nursing notes.   HISTORY  Chief Complaint Shortness of Breath and Covid Positive    HPI Krystal Harrison is a 35 y.o. female with no significant past medical history presents to the ED complaining of shortness of breath.  Patient reports that she has had approximately 2 days of cough, malaise, sore throat, body aches, and shortness of breath.  She took a home test for COVID-19 yesterday that came back positive.  She has been taking Tylenol as needed for body aches and sore throat with only partial relief.  She reports feeling feverish with chills, but has not taken her temperature.  She denies any pain in her chest but she reports feeling out of breath with any exertion.  She does state that she recently had a tummy tuck, liposuction, and butt lift procedure performed in the [place] in mid [place].  She performs regular dressing changes and denies any redness, increasing pain, or drainage from her incisions.        Past Medical History:  Diagnosis Date   Complication of anesthesia    pt. reports no knowledge of complications   Herpes genitalia    last outbreak 1-2 yrs ago    Patient Active Problem List   Diagnosis Date Noted   Vaginal discharge 10/18/2019   Stomach pain 10/18/2019   Loose stools 10/18/2019   Vitamin D deficiency 10/12/2015   Increased BMI 07/19/2015   Genital HSV 07/05/2015   Chronic constipation 01/31/2015   Knee MCL sprain 06/03/2013    Past Surgical History:  Procedure Laterality Date   EYE SURGERY     EYE SURGERY Bilateral     Prior to Admission medications   Medication Sig Start Date End Date Taking? Authorizing Provider  nirmatrelvir/ritonavir EUA (PAXLOVID) TABS Take 3 tablets  by mouth 2 (two) times daily for 5 days. Patient GFR is >60. Take nirmatrelvir (150 mg) two tablets twice daily for 5 days and ritonavir (100 mg) one tablet twice daily for 5 days. 07/08/20 07/13/20 Yes Blake Divine, MD  etonogestrel-ethinyl estradiol (NUVARING) 0.12-0.015 MG/24HR vaginal ring Insert vaginally and leave in place for 3 consecutive weeks, then remove for 1 week. 10/18/19   Philip Aspen, CNM    Allergies Patient has no known allergies.  Family History  Problem Relation Age of Onset   Diabetes Mother    Congestive Heart Failure Maternal Grandmother    Anesthesia problems Neg Hx     Social History Social History   Tobacco Use   Smoking status: Never   Smokeless tobacco: Never  Vaping Use   Vaping Use: Never used  Substance Use Topics   Alcohol use: No   Drug use: No    Review of Systems  Constitutional: Positive for subjective fever/chills Eyes: No visual changes. ENT: Positive for sore throat. Cardiovascular: Denies chest pain. Respiratory: Positive for cough and shortness of breath. Gastrointestinal: No abdominal pain.  No nausea, no vomiting.  No diarrhea.  No constipation. Genitourinary: Negative for dysuria. Musculoskeletal: Negative for back pain.  Positive for myalgias. Skin: Negative for rash. Neurological: Negative for headaches, focal weakness or numbness.  ____________________________________________   PHYSICAL EXAM:  VITAL SIGNS: ED Triage Vitals  Enc Vitals Group     BP 07/08/20 0738 124/78  Pulse Rate 07/08/20 0738 (!) 104     Resp 07/08/20 0738 20     Temp 07/08/20 0738 98.6 F (37 C)     Temp Source 07/08/20 0738 Oral     SpO2 07/08/20 0738 97 %     Weight 07/08/20 0739 212 lb (96.2 kg)     Height 07/08/20 0739 5\' 6"  (1.676 m)     Head Circumference --      Peak Flow --      Pain Score 07/08/20 0739 8     Pain Loc --      Pain Edu? --      Excl. in [place]? --     Constitutional: Alert and oriented. Eyes: Conjunctivae are  normal. Head: Atraumatic. Nose: No congestion/rhinnorhea. Mouth/Throat: Mucous membranes are moist.  Mild erythema of posterior oropharynx with no edema or exudates. Neck: Normal ROM Cardiovascular: Normal rate, regular rhythm. Grossly normal heart sounds. Respiratory: Normal respiratory effort.  No retractions. Lungs CTAB. Gastrointestinal: Soft and nontender. No distention. Genitourinary: deferred Musculoskeletal: No lower extremity tenderness nor edema. Neurologic:  Normal speech and language. No gross focal neurologic deficits are appreciated. Skin:  Skin is warm, dry and intact. No rash noted.  Incision sites to her abdomen and back are clean, dry, and intact with no surrounding erythema, warmth, or tenderness. Psychiatric: Mood and affect are normal. Speech and behavior are normal.  ____________________________________________   LABS (all labs ordered are listed, but only abnormal results are displayed)  Labs Reviewed  BASIC METABOLIC PANEL - Abnormal; Notable for the following components:      Result Value   Glucose, Bld 105 (*)    Calcium 8.8 (*)    All other components within normal limits  CBC - Abnormal; Notable for the following components:   RBC 3.38 (*)    Hemoglobin 9.1 (*)    HCT 29.4 (*)    RDW 16.8 (*)    Platelets 518 (*)    All other components within normal limits  SARS CORONAVIRUS 2 (TAT 6-24 HRS)  POC URINE PREG, ED  TROPONIN I (HIGH SENSITIVITY)  TROPONIN I (HIGH SENSITIVITY)   ____________________________________________  EKG  ED ECG REPORT I, Blake Divine, the attending physician, personally viewed and interpreted this ECG.   Date: 07/08/2020  EKG Time: 7:41  Rate: 114  Rhythm: sinus tachycardia  Axis: Normal  Intervals:none  ST&T Change: None   PROCEDURES  Procedure(s) performed (including Critical Care):  Procedures   ____________________________________________   INITIAL IMPRESSION / ASSESSMENT AND PLAN / ED COURSE       35 year old female with no significant past medical history presents to the ED with subjective fevers, cough, sore throat, and body aches worsening over the past 2 to 3 days.  She tested positive for COVID-19 at home and we will send confirmatory PCR.  EKG shows no evidence of arrhythmia or ischemia, chest x-ray reviewed by me and shows no infiltrate, edema, or effusion.  Patient is not in any respiratory distress and is maintaining O2 sats on room air.  Labs are pending, but if these are unremarkable patient be appropriate for discharge home with prescription for Paxlovid given her BMI of greater than 30.  We will give dose of oral Decadron for her pharyngitis.  Labs are unremarkable, troponin within normal limits.  Patient is appropriate for discharge home and has been prescribed Paxlovid.  She was counseled to return to the ED for new worsening symptoms, patient agrees with plan.  ____________________________________________   FINAL CLINICAL IMPRESSION(S) / ED DIAGNOSES  Final diagnoses:  COVID-19  Shortness of breath     ED Discharge Orders          Ordered    nirmatrelvir/ritonavir EUA (PAXLOVID) TABS  2 times daily        07/08/20 1033             Note:  This document was prepared using Dragon voice recognition software and may include unintentional dictation errors.    Blake Divine, MD 07/08/20 1034

## 2020-07-08 NOTE — ED Notes (Signed)
See triage note, pt reports here for COVID sx, started having sx 3 days ago. Reports headache, sore throat and SHOB. RR even and unlabored. Cough noted Call bell in reach ,

## 2020-07-08 NOTE — ED Triage Notes (Addendum)
Pt arrives via pov w/ c/o SOB x2 days. Pt reports testing positive for covid 59 yesterday. Pt also c/o sore throat, earache. RR even and unlabored. Pt denies any CP. NAD noted at this time. Pt reports having a tummy tuck, muscle repair, lipo 360, chin lipo, and brazilian but lift on June 15th, 2022.

## 2020-07-09 LAB — SARS CORONAVIRUS 2 (TAT 6-24 HRS): SARS Coronavirus 2: POSITIVE — AB

## 2020-11-15 ENCOUNTER — Other Ambulatory Visit: Payer: Self-pay

## 2020-11-15 ENCOUNTER — Ambulatory Visit (INDEPENDENT_AMBULATORY_CARE_PROVIDER_SITE_OTHER): Payer: Medicaid Other | Admitting: Certified Nurse Midwife

## 2020-11-15 ENCOUNTER — Encounter: Payer: Self-pay | Admitting: Certified Nurse Midwife

## 2020-11-15 VITALS — BP 108/73 | HR 98 | Ht 66.0 in | Wt 205.8 lb

## 2020-11-15 DIAGNOSIS — R21 Rash and other nonspecific skin eruption: Secondary | ICD-10-CM

## 2020-11-15 MED ORDER — VALACYCLOVIR HCL 1 G PO TABS: 1000.0000 mg | ORAL_TABLET | Freq: Every day | ORAL | 5 refills | Status: AC

## 2020-11-15 NOTE — Patient Instructions (Signed)
Rash, Adult  A rash is a change in the color of your skin. A rash can also change the way your skin feels. There are many different conditions and factors that can causea rash. Follow these instructions at home: The goal of treatment is to stop the itching and keep the rash from spreading. Watch for any changes in your symptoms. Let your doctor know about them. Followthese instructions to help with your condition: Medicine Take or apply over-the-counter and prescription medicines only as told by your doctor. These may include medicines: To treat red or swollen skin (corticosteroid creams). To treat itching. To treat an allergy (oral antihistamines). To treat very bad symptoms (oral corticosteroids).  Skin care Put cool cloths (compresses) on the affected areas. Do not scratch or rub your skin. Avoid covering the rash. Make sure that the rash is exposed to air as much as possible. Managing itching and discomfort Avoid hot showers or baths. These can make itching worse. A cold shower may help. Try taking a bath with: Epsom salts. You can get these at your local pharmacy or grocery store. Follow the instructions on the package. Baking soda. Pour a small amount into the bath as told by your doctor. Colloidal oatmeal. You can get this at your local pharmacy or grocery store. Follow the instructions on the package. Try putting baking soda paste onto your skin. Stir water into baking soda until it gets like a paste. Try putting on a lotion that relieves itchiness (calamine lotion). Keep cool and out of the sun. Sweating and being hot can make itching worse. General instructions  Rest as needed. Drink enough fluid to keep your pee (urine) pale yellow. Wear loose-fitting clothing. Avoid scented soaps, detergents, and perfumes. Use gentle soaps, detergents, perfumes, and other cosmetic products. Avoid anything that causes your rash. Keep a journal to help track what causes your rash. Write  down: What you eat. What cosmetic products you use. What you drink. What you wear. This includes jewelry. Keep all follow-up visits as told by your doctor. This is important.  Contact a doctor if: You sweat at night. You lose weight. You pee (urinate) more than normal. You pee less than normal, or you notice that your pee is a darker color than normal. You feel weak. You throw up (vomit). Your skin or the whites of your eyes look yellow (jaundice). Your skin: Tingles. Is numb. Your rash: Does not go away after a few days. Gets worse. You are: More thirsty than normal. More tired than normal. You have: New symptoms. Pain in your belly (abdomen). A fever. Watery poop (diarrhea). Get help right away if: You have a fever and your symptoms suddenly get worse. You start to feel mixed up (confused). You have a very bad headache or a stiff neck. You have very bad joint pains or stiffness. You have jerky movements that you cannot control (seizure). Your rash covers all or most of your body. The rash may or may not be painful. You have blisters that: Are on top of the rash. Grow larger. Grow together. Are painful. Are inside your nose or mouth. You have a rash that: Looks like purple pinprick-sized spots all over your body. Has a "bull's eye" or looks like a target. Is red and painful, causes your skin to peel, and is not from being in the sun too long. Summary A rash is a change in the color of your skin. A rash can also change the way your skin feels.   The goal of treatment is to stop the itching and keep the rash from spreading. Take or apply over-the-counter and prescription medicines only as told by your doctor. Contact a doctor if you have new symptoms or symptoms that get worse. Keep all follow-up visits as told by your doctor. This is important. This information is not intended to replace advice given to you by your health care provider. Make sure you discuss any  questions you have with your healthcare provider. Document Revised: 04/17/2018 Document Reviewed: 07/28/2017 Elsevier Patient Education  2022 [employer]  

## 2020-11-15 NOTE — Progress Notes (Signed)
GYN ENCOUNTER NOTE  Subjective:       Krystal Harrison is a 35 y.o. [phone] female is here for gynecologic evaluation of the following issues:  1. Rash all over her body 2.refill for HSV .     Gynecologic History No LMP recorded. Contraception: NuvaRing vaginal inserts Last Pap: 06/03/2018. Results were: normal Last mammogram: n/a .    The pregnancy intention screening data noted above was reviewed. Potential methods of contraception were discussed. The patient elected to proceed with considering IUD.   Obstetric History OB History  Gravida Para Term Preterm AB Living  6 5 5  0 1 5  SAB IAB Ectopic Multiple Live Births  1 0 0 0 5    # Outcome Date GA Lbr Len/2nd Weight Sex Delivery Anes PTL Lv  6 Term 01/29/18 [redacted]w[redacted]d 13:48 / 00:27 8 lb 11.7 oz (3.96 kg) F Vag-Spont None  LIV  5 Term 08/29/15 [redacted]w[redacted]d 06:41 / 00:02 8 lb 1.8 oz (3.68 kg) M Vag-Spont None  LIV  4 SAB 2016        FD  3 Term 09/12/11 [redacted]w[redacted]d  8 lb 12 oz (3.969 kg) F Vag-Spont  Y LIV  2 Term 12/31/07 [redacted]w[redacted]d  7 lb 14 oz (3.572 kg) M Vag-Spont  N LIV  1 Term 09/07/04 [redacted]w[redacted]d  7 lb 15 oz (3.6 kg) F Vag-Spont   LIV    Obstetric Comments  09/12/2011 pt leaking fluid at 32wks, hospitalized until delivery at 38wks.    Past Medical History:  Diagnosis Date   Complication of anesthesia    pt. reports no knowledge of complications   Herpes genitalia    last outbreak 1-2 yrs ago    Past Surgical History:  Procedure Laterality Date   EYE SURGERY     EYE SURGERY Bilateral   . Tummy Tuck   Current Outpatient Medications on File Prior to Visit  Medication Sig Dispense Refill   etonogestrel-ethinyl estradiol (NUVARING) 0.12-0.015 MG/24HR vaginal ring Insert vaginally and leave in place for 3 consecutive weeks, then remove for 1 week. (Patient not taking: Reported on 11/15/2020) 1 each 12   No current facility-administered medications on file prior to visit.    No Known Allergies  Social History   Socioeconomic History    Marital status: Single    Spouse name: Not on file   Number of children: Not on file   Years of education: Not on file   Highest education level: Not on file  Occupational History   Not on file  Tobacco Use   Smoking status: Never   Smokeless tobacco: Never  Vaping Use   Vaping Use: Never used  Substance and Sexual Activity   Alcohol use: No   Drug use: No   Sexual activity: Yes    Birth control/protection: None, Inserts    Comment: Possibly BTL   Other Topics Concern   Not on file  Social History Narrative   Not on file   Social Determinants of Health   Financial Resource Strain: Not on file  Food Insecurity: Not on file  Transportation Needs: Not on file  Physical Activity: Not on file  Stress: Not on file  Social Connections: Not on file  Intimate Partner Violence: Not on file    Family History  Problem Relation Age of Onset   Diabetes Mother    Congestive Heart Failure Maternal Grandmother    Anesthesia problems Neg Hx     The following portions of the patient's history were reviewed  and updated as appropriate: allergies, current medications, past family history, past medical history, past social history, past surgical history and problem list.  Review of Systems Review of Systems - Negative except as mentioned in HPI Review of Systems - General ROS: negative for - chills, fatigue, fever, hot flashes, malaise or night sweats Hematological and Lymphatic ROS: negative for - bleeding problems or swollen lymph nodes Gastrointestinal ROS: negative for - abdominal pain, blood in stools, change in bowel habits and nausea/vomiting Musculoskeletal ROS: negative for - joint pain, muscle pain or muscular weakness Genito-Urinary ROS: negative for - change in menstrual cycle, dysmenorrhea, dyspareunia, dysuria, genital discharge, genital ulcers, hematuria, incontinence, irregular/heavy menses, nocturia or pelvic pain  Objective:   BP 108/73   Pulse 98   Ht 5\' 6"  (1.676  m)   Wt 205 lb 12.8 oz (93.4 kg)   BMI 33.22 kg/m  CONSTITUTIONAL: Well-developed, well-nourished female in no acute distress.  HENT:  Normocephalic, atraumatic.  NECK: Normal range of motion, supple, no masses.  Normal thyroid.  SKIN: Skin is warm and dry. rash noted-legs, arms abdomen, red itchy , excoriation marks from scratching.. Not diaphoretic. No erythema. No pallor. [place]: Alert and oriented to person, place, and time. PSYCHIATRIC: Normal mood and affect. Normal behavior. Normal judgment and thought content. CARDIOVASCULAR:Not Examined RESPIRATORY: Not Examined BREASTS: Not Examined ABDOMEN: Soft, non distended; Non tender.  No Organomegaly. PELVIC:not indicated MUSCULOSKELETAL: Normal range of motion. No tenderness.  No cyanosis, clubbing, or edema.     Assessment:  Rash   Plan:   Likely allergic response, discussed changing detergent, soap, shampoo, lotions to see if rash improves. Discussed referral for further testing. Refill placed for valtrex, pt state she had a recent outbreak but it has now subsided. Referral placed. Encouraged use of benadryl, and or benadryl cream, cool bath to alleviate itching. She verbalizes and agrees to plan of care. Follow up prn   Philip Aspen, CNM

## 2020-11-16 ENCOUNTER — Ambulatory Visit (INDEPENDENT_AMBULATORY_CARE_PROVIDER_SITE_OTHER): Payer: Medicaid Other | Admitting: Dermatology

## 2020-11-16 DIAGNOSIS — L858 Other specified epidermal thickening: Secondary | ICD-10-CM | POA: Diagnosis not present

## 2020-11-16 DIAGNOSIS — R21 Rash and other nonspecific skin eruption: Secondary | ICD-10-CM | POA: Diagnosis not present

## 2020-11-16 MED ORDER — TRIAMCINOLONE ACETONIDE 0.1 % EX OINT
TOPICAL_OINTMENT | CUTANEOUS | 0 refills | Status: AC
Start: 2020-11-16 — End: ?

## 2020-11-16 MED ORDER — HYDROXYZINE HCL 25 MG PO TABS: ORAL_TABLET | ORAL | 0 refills | Status: AC

## 2020-11-16 NOTE — Progress Notes (Signed)
   New Patient Visit  Subjective  Fraya Ueda is a 35 y.o. female who presents for the following: rash  (Legs, chest, buttocks, arms - itching that started a week and a half ago. No one at home has itching or rash. No changes in detergents, soaps, shampoos, etc. ).   The following portions of the chart were reviewed this encounter and updated as appropriate:   Tobacco  Allergies  Meds  Problems  Med Hx  Surg Hx  Fam Hx      Review of Systems:  No other skin or systemic complaints except as noted in HPI or Assessment and Plan.  Objective  Well appearing patient in no apparent distress; mood and affect are within normal limits.  A focused examination was performed including the trunk and extremities. Relevant physical exam findings are noted in the Assessment and Plan.  B/L arms Tiny follicular keratotic papules.   Trunk, extremities Xerosis few erythematous patches and excoriations. Faint dermatographism.    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 No cure. - Observe. - If desired, patient can use an emollient (moisturizer) containing ammonium lactate, urea or salicylic acid once a day to smooth the area   Rash and other nonspecific skin eruption Trunk, extremities  Eczema, favored due to weather changes  Recommend Dove for Sensitive Skin soaps when bathing, and free and gentle laundry detergents. Gentle skin care handout given.   Consider patch testing if condition not improving or worsening at follow up appointment.   Start TMC 0.1% ointment to aa's BID up to two weeks. Topical steroids (such as triamcinolone, fluocinolone, fluocinonide, mometasone, clobetasol, halobetasol, betamethasone, hydrocortisone) can cause thinning and lightening of the skin if they are used for too long in the same area. Your physician has selected the right strength medicine for your problem and area affected on the body. Please use your medication  only as directed by your physician to prevent side effects.   Start OTC Zyrtec QHS if still itchy OK to use BID.   Start Hydroxyzine 25mg  1-2 tabs po QHS to help with itch. May make drowsy - Do not drive after use.  Recommend OTC Gold Bond itch relief cream PRN itch.   triamcinolone ointment (KENALOG) 0.1 % - Trunk, extremities Apply to itchy rash areas BID up to two weeks. Avoid the face, groin, and axilla.  hydrOXYzine (ATARAX/VISTARIL) 25 MG tablet - Trunk, extremities Take 1-2 tabs po QHS PRN itch. May make drowsy.  Return for rash f/u in 4 -6 weeks.  Luther Redo, CMA, am acting as scribe for Forest Gleason, MD .  Documentation: I have reviewed the above documentation for accuracy and completeness, and I agree with the above.  Forest Gleason, MD

## 2020-11-16 NOTE — Patient Instructions (Addendum)
Gentle Skin Care Guide  1. Bathe no more than once a day.  2. Avoid bathing in hot water  3. Use a mild soap like Dove, Vanicream, Cetaphil, CeraVe. Can use Lever 2000 or Cetaphil antibacterial soap  4. Use soap only where you need it. On most days, use it under your arms, between your legs, and on your feet. Let the water rinse other areas unless visibly dirty.  5. When you get out of the bath/shower, use a towel to gently blot your skin dry, don't rub it.  6. While your skin is still a little damp, apply a moisturizing cream such as Vanicream, CeraVe, Cetaphil, Eucerin, Sarna lotion or plain Vaseline Jelly. For hands apply Neutrogena [place] Hand Cream or Excipial Hand Cream.  7. Reapply moisturizer any time you start to itch or feel dry.  8. Sometimes using free and clear laundry detergents can be helpful. Fabric softener sheets should be avoided. Downy Free & Gentle liquid, or any liquid fabric softener that is free of dyes and perfumes, it acceptable to use  9. If your doctor has given you prescription creams you may apply moisturizers over them   Recommend starting Zyrtec once to twice daily until itching under control.   Recommend over the counter Gold Bond anti itch moisturizer as needed throughout the day.  If you have any questions or concerns for your doctor, please call our main line at [phone] and press option 4 to reach your doctor's medical assistant. If no one answers, please leave a voicemail as directed and we will return your call as soon as possible. Messages left after 4 pm will be answered the following business day.   You may also send [place] a message via [place]. We typically respond to MyChart messages within 1-2 business days.  For prescription refills, please ask your pharmacy to contact our office. Our fax number is [phone].  If you have an urgent issue when the clinic is closed that cannot wait until the next business day, you can page your doctor  at the number below.    Please note that while we do our best to be available for urgent issues outside of office hours, we are not available 24/7.   If you have an urgent issue and are unable to reach [place], you may choose to seek medical care at your doctor's office, retail clinic, urgent care center, or emergency room.  If you have a medical emergency, please immediately call 911 or go to the emergency department.  Pager Numbers  - Dr. Nehemiah Massed: [phone]  - Dr. Laurence Ferrari: [phone]  - Dr. Nicole Kindred: [phone]  In the event of inclement weather, please call our main line at [phone] for an update on the status of any delays or closures.  Dermatology Medication Tips: Please keep the boxes that topical medications come in in order to help keep track of the instructions about where and how to use these. Pharmacies typically print the medication instructions only on the boxes and not directly on the medication tubes.   If your medication is too expensive, please contact our office at [phone] option 4 or send [place] a message through [place].   We are unable to tell what your co-pay for medications will be in advance as this is different depending on your insurance coverage. However, we may be able to find a substitute medication at lower cost or fill out paperwork to get insurance to cover a needed medication.   If a prior authorization is  required to get your medication covered by your insurance company, please allow [place] 1-2 business days to complete this process.  Drug prices often vary depending on where the prescription is filled and some pharmacies may offer cheaper prices.  The website www.goodrx.com contains coupons for medications through different pharmacies. The prices here do not account for what the cost may be with help from insurance (it may be cheaper with your insurance), but the website can give you the price if you did not use any insurance.  - You can print the  associated coupon and take it with your prescription to the pharmacy.  - You may also stop by our office during regular business hours and pick up a GoodRx coupon card.  - If you need your prescription sent electronically to a different pharmacy, notify our office through [facility] or by phone at [phone] option 4.

## 2020-11-21 ENCOUNTER — Encounter: Payer: Self-pay | Admitting: Dermatology

## 2020-12-22 ENCOUNTER — Telehealth: Payer: Self-pay

## 2020-12-22 NOTE — Telephone Encounter (Signed)
Patient informed Dr. Laurence Ferrari would not be in the office the week of 12/26/20. Patient states she was at work and will return our call next Tuesday when she is off of work to reschedule the appointment.

## 2020-12-26 ENCOUNTER — Ambulatory Visit: Payer: Medicaid Other | Admitting: Dermatology

## 2021-04-23 ENCOUNTER — Encounter: Payer: Self-pay | Admitting: Certified Nurse Midwife

## 2021-05-21 ENCOUNTER — Ambulatory Visit (INDEPENDENT_AMBULATORY_CARE_PROVIDER_SITE_OTHER): Payer: Medicaid Other | Admitting: Obstetrics

## 2021-05-21 ENCOUNTER — Other Ambulatory Visit (HOSPITAL_COMMUNITY)
Admission: RE | Admit: 2021-05-21 | Discharge: 2021-05-21 | Disposition: A | Discharge status: Home / Self Care | Payer: Medicaid Other | Source: Ambulatory Visit | Attending: Obstetrics | Admitting: Obstetrics

## 2021-05-21 VITALS — BP 118/75 | Ht 66.0 in | Wt 208.3 lb

## 2021-05-21 DIAGNOSIS — N949 Unspecified condition associated with female genital organs and menstrual cycle: Secondary | ICD-10-CM

## 2021-05-21 NOTE — Progress Notes (Signed)
GYN ENCOUNTER ? ?Subjective ? ?HPI: Krystal Harrison is a 36 y.o. [id number] who presents today for vaginal burning and irritation. She reports that about a week ago, she noticed that her vagina felt swollen, painful, and itchy. It burned when she washed with soap. She denies discharge and odor. She reports that she took some yeast medication that she had leftover, the symptoms improved, and her period started. She is currently only having mild discomfort and does not feel as swollen. She has not had intercourse since before the discomfort started. She reports that she washes her vulva with scented soap and does not wash inside the vagina. She denies any changes to her detergents, known STI exposures, or other environmental exposures. She denies abdominal pain and urinary symptoms. ? ?Past Medical History:  ?Diagnosis Date  ? Complication of anesthesia   ? pt. reports no knowledge of complications  ? Herpes genitalia   ? last outbreak 1-2 yrs ago  ? ?Past Surgical History:  ?Procedure Laterality Date  ? EYE SURGERY    ? EYE SURGERY Bilateral   ? ?OB History   ? ? Gravida  ?6  ? Para  ?5  ? Term  ?5  ? Preterm  ?0  ? AB  ?1  ? Living  ?5  ?  ? ? SAB  ?1  ? IAB  ?0  ? Ectopic  ?0  ? Multiple  ?0  ? Live Births  ?5  ?   ?  ? Obstetric Comments  ?09/12/2011 pt leaking fluid at 32wks, hospitalized until delivery at 38wks.  ?  ? ?  ? ?No Known Allergies ? ?ROS ?Negative except as noted in HPI ?History obtained from the patient ? ?Objective ? ?BP 118/75   Ht '5\' 6"'$  (1.676 m)   Wt 208 lb 4.8 oz (94.5 kg)   LMP  (LMP Unknown)   BMI 33.62 kg/m?  ? ?Pelvic: External genitalia normal, Vagina normal without discharge, cervix normal in appearance, no CMT, rectovaginal septum normal. Small amount of menstrual blood noted.  Swabs collected. ? ?Assessment ?1) Vaginal irritation ? ?Plan ?1) Swabbed for BV, yeast, GC/chlamydia, and trichomonas. Review vaginal/vulvar hygiene. Encouraged comfort measures. Will f/u based on swab results.   ? ? ?Lloyd Huger, CNM ? ? ?  ?

## 2021-05-23 ENCOUNTER — Encounter: Payer: Self-pay | Admitting: Certified Nurse Midwife

## 2021-05-23 DIAGNOSIS — B9689 Other specified bacterial agents as the cause of diseases classified elsewhere: Secondary | ICD-10-CM

## 2021-05-23 LAB — CERVICOVAGINAL ANCILLARY ONLY
Bacterial Vaginitis (gardnerella): POSITIVE — AB
Candida Glabrata: NEGATIVE
Candida Vaginitis: NEGATIVE
Chlamydia: NEGATIVE
Comment: NEGATIVE
Comment: NEGATIVE
Comment: NEGATIVE
Comment: NEGATIVE
Comment: NEGATIVE
Comment: NORMAL
Neisseria Gonorrhea: NEGATIVE
Trichomonas: NEGATIVE

## 2021-05-24 ENCOUNTER — Other Ambulatory Visit: Payer: Self-pay | Admitting: Obstetrics

## 2021-05-24 MED ORDER — METRONIDAZOLE 0.75 % VA GEL: 1.0000 | Freq: Two times a day (BID) | VAGINAL | 0 refills | Status: AC

## 2021-09-12 ENCOUNTER — Encounter: Payer: Medicaid Other | Admitting: Certified Nurse Midwife

## 2021-10-03 ENCOUNTER — Encounter: Payer: Medicaid Other | Admitting: Certified Nurse Midwife

## 2021-10-03 DIAGNOSIS — Z01419 Encounter for gynecological examination (general) (routine) without abnormal findings: Secondary | ICD-10-CM
